# Patient Record
Sex: Female | Born: 1935 | Race: White | Hispanic: No | Marital: Married | Smoking: Never smoker
Health system: Southern US, Community
[De-identification: ages and names within clinical notes are randomized; demographics above are authoritative.]

## PROBLEM LIST (undated history)

## (undated) DIAGNOSIS — G3184 Mild cognitive impairment, so stated: Secondary | ICD-10-CM

## (undated) DIAGNOSIS — D696 Thrombocytopenia, unspecified: Secondary | ICD-10-CM

## (undated) DIAGNOSIS — K219 Gastro-esophageal reflux disease without esophagitis: Secondary | ICD-10-CM

## (undated) DIAGNOSIS — M810 Age-related osteoporosis without current pathological fracture: Secondary | ICD-10-CM

## (undated) DIAGNOSIS — W19XXXA Unspecified fall, initial encounter: Secondary | ICD-10-CM

## (undated) DIAGNOSIS — I639 Cerebral infarction, unspecified: Secondary | ICD-10-CM

## (undated) DIAGNOSIS — E538 Deficiency of other specified B group vitamins: Secondary | ICD-10-CM

## (undated) DIAGNOSIS — I10 Essential (primary) hypertension: Secondary | ICD-10-CM

## (undated) DIAGNOSIS — E785 Hyperlipidemia, unspecified: Secondary | ICD-10-CM

## (undated) DIAGNOSIS — I672 Cerebral atherosclerosis: Secondary | ICD-10-CM

## (undated) DIAGNOSIS — I2510 Atherosclerotic heart disease of native coronary artery without angina pectoris: Secondary | ICD-10-CM

## (undated) MED FILL — Romiplostim For Inj 250 MCG: SUBCUTANEOUS | Qty: 0.32 | Status: AC

## (undated) MED FILL — Romiplostim For Inj 250 MCG: SUBCUTANEOUS | Qty: 0.33 | Status: AC

## (undated) MED FILL — Romiplostim For Inj 125 MCG: SUBCUTANEOUS | Qty: 0.22 | Status: AC

---

## 2021-03-23 DIAGNOSIS — R5383 Other fatigue: Secondary | ICD-10-CM | POA: Insufficient documentation

## 2021-03-23 DIAGNOSIS — F0390 Unspecified dementia without behavioral disturbance: Secondary | ICD-10-CM | POA: Insufficient documentation

## 2021-03-23 DIAGNOSIS — F419 Anxiety disorder, unspecified: Secondary | ICD-10-CM | POA: Insufficient documentation

## 2021-03-23 DIAGNOSIS — Z780 Asymptomatic menopausal state: Secondary | ICD-10-CM | POA: Insufficient documentation

## 2021-03-23 DIAGNOSIS — R5381 Other malaise: Secondary | ICD-10-CM

## 2021-03-23 DIAGNOSIS — G3184 Mild cognitive impairment, so stated: Secondary | ICD-10-CM | POA: Insufficient documentation

## 2021-03-23 HISTORY — DX: Anxiety disorder, unspecified: F41.9

## 2021-03-23 HISTORY — DX: Other malaise: R53.81

## 2021-03-23 HISTORY — DX: Other malaise: R53.83

## 2021-03-24 DIAGNOSIS — D696 Thrombocytopenia, unspecified: Secondary | ICD-10-CM | POA: Insufficient documentation

## 2021-03-24 DIAGNOSIS — D693 Immune thrombocytopenic purpura: Secondary | ICD-10-CM | POA: Insufficient documentation

## 2021-03-24 DIAGNOSIS — E538 Deficiency of other specified B group vitamins: Secondary | ICD-10-CM | POA: Insufficient documentation

## 2021-04-20 DIAGNOSIS — M810 Age-related osteoporosis without current pathological fracture: Secondary | ICD-10-CM

## 2021-04-20 HISTORY — DX: Age-related osteoporosis without current pathological fracture: M81.0

## 2021-08-18 DIAGNOSIS — K219 Gastro-esophageal reflux disease without esophagitis: Secondary | ICD-10-CM | POA: Insufficient documentation

## 2021-08-18 DIAGNOSIS — M150 Primary generalized (osteo)arthritis: Secondary | ICD-10-CM

## 2021-08-18 DIAGNOSIS — M159 Polyosteoarthritis, unspecified: Secondary | ICD-10-CM

## 2021-08-18 HISTORY — DX: Gastro-esophageal reflux disease without esophagitis: K21.9

## 2021-08-18 HISTORY — DX: Polyosteoarthritis, unspecified: M15.9

## 2021-08-18 HISTORY — DX: Primary generalized (osteo)arthritis: M15.0

## 2021-09-26 DIAGNOSIS — F330 Major depressive disorder, recurrent, mild: Secondary | ICD-10-CM | POA: Insufficient documentation

## 2021-09-26 DIAGNOSIS — L821 Other seborrheic keratosis: Secondary | ICD-10-CM | POA: Insufficient documentation

## 2021-09-26 DIAGNOSIS — R351 Nocturia: Secondary | ICD-10-CM | POA: Insufficient documentation

## 2021-09-26 HISTORY — DX: Major depressive disorder, recurrent, mild: F33.0

## 2021-09-26 HISTORY — DX: Nocturia: R35.1

## 2021-09-26 HISTORY — DX: Other seborrheic keratosis: L82.1

## 2022-01-15 DIAGNOSIS — I351 Nonrheumatic aortic (valve) insufficiency: Secondary | ICD-10-CM | POA: Diagnosis not present

## 2022-01-15 DIAGNOSIS — I340 Nonrheumatic mitral (valve) insufficiency: Secondary | ICD-10-CM | POA: Diagnosis not present

## 2022-01-15 DIAGNOSIS — I361 Nonrheumatic tricuspid (valve) insufficiency: Secondary | ICD-10-CM | POA: Diagnosis not present

## 2022-01-23 DIAGNOSIS — Z9181 History of falling: Secondary | ICD-10-CM | POA: Insufficient documentation

## 2022-01-23 DIAGNOSIS — Z8673 Personal history of transient ischemic attack (TIA), and cerebral infarction without residual deficits: Secondary | ICD-10-CM

## 2022-01-23 DIAGNOSIS — I672 Cerebral atherosclerosis: Secondary | ICD-10-CM | POA: Insufficient documentation

## 2022-01-23 HISTORY — DX: History of falling: Z91.81

## 2022-01-23 HISTORY — DX: Personal history of transient ischemic attack (TIA), and cerebral infarction without residual deficits: Z86.73

## 2022-03-09 ENCOUNTER — Inpatient Hospital Stay (HOSPITAL_COMMUNITY)
Admission: EM | Admit: 2022-03-09 | Discharge: 2022-03-12 | DRG: 247 | Disposition: A | Discharge status: Home Health | Payer: Medicare Other | Source: Other Acute Inpatient Hospital | Attending: Cardiovascular Disease | Admitting: Cardiovascular Disease

## 2022-03-09 ENCOUNTER — Other Ambulatory Visit (HOSPITAL_COMMUNITY): Payer: Self-pay

## 2022-03-09 ENCOUNTER — Inpatient Hospital Stay (HOSPITAL_COMMUNITY): Payer: Medicare Other

## 2022-03-09 ENCOUNTER — Encounter (HOSPITAL_COMMUNITY): Payer: Self-pay | Admitting: Physician Assistant

## 2022-03-09 ENCOUNTER — Inpatient Hospital Stay (HOSPITAL_COMMUNITY)
Admission: EM | Disposition: A | Discharge status: Home Health | Payer: Self-pay | Source: Other Acute Inpatient Hospital | Attending: Cardiovascular Disease

## 2022-03-09 DIAGNOSIS — E785 Hyperlipidemia, unspecified: Secondary | ICD-10-CM | POA: Diagnosis present

## 2022-03-09 DIAGNOSIS — W06XXXA Fall from bed, initial encounter: Secondary | ICD-10-CM | POA: Diagnosis present

## 2022-03-09 DIAGNOSIS — M25532 Pain in left wrist: Secondary | ICD-10-CM | POA: Diagnosis not present

## 2022-03-09 DIAGNOSIS — I10 Essential (primary) hypertension: Secondary | ICD-10-CM | POA: Diagnosis present

## 2022-03-09 DIAGNOSIS — S0010XA Contusion of unspecified eyelid and periocular area, initial encounter: Secondary | ICD-10-CM | POA: Diagnosis present

## 2022-03-09 DIAGNOSIS — W19XXXA Unspecified fall, initial encounter: Secondary | ICD-10-CM

## 2022-03-09 DIAGNOSIS — F05 Delirium due to known physiological condition: Secondary | ICD-10-CM | POA: Diagnosis present

## 2022-03-09 DIAGNOSIS — R739 Hyperglycemia, unspecified: Secondary | ICD-10-CM | POA: Diagnosis present

## 2022-03-09 DIAGNOSIS — D649 Anemia, unspecified: Secondary | ICD-10-CM | POA: Diagnosis not present

## 2022-03-09 DIAGNOSIS — I672 Cerebral atherosclerosis: Secondary | ICD-10-CM | POA: Diagnosis present

## 2022-03-09 DIAGNOSIS — S0181XA Laceration without foreign body of other part of head, initial encounter: Secondary | ICD-10-CM | POA: Diagnosis present

## 2022-03-09 DIAGNOSIS — Z7982 Long term (current) use of aspirin: Secondary | ICD-10-CM

## 2022-03-09 DIAGNOSIS — I2582 Chronic total occlusion of coronary artery: Secondary | ICD-10-CM | POA: Diagnosis present

## 2022-03-09 DIAGNOSIS — I213 ST elevation (STEMI) myocardial infarction of unspecified site: Principal | ICD-10-CM

## 2022-03-09 DIAGNOSIS — D696 Thrombocytopenia, unspecified: Secondary | ICD-10-CM | POA: Diagnosis present

## 2022-03-09 DIAGNOSIS — I2119 ST elevation (STEMI) myocardial infarction involving other coronary artery of inferior wall: Principal | ICD-10-CM | POA: Diagnosis present

## 2022-03-09 DIAGNOSIS — Z79899 Other long term (current) drug therapy: Secondary | ICD-10-CM | POA: Diagnosis not present

## 2022-03-09 DIAGNOSIS — Z7983 Long term (current) use of bisphosphonates: Secondary | ICD-10-CM

## 2022-03-09 DIAGNOSIS — I2510 Atherosclerotic heart disease of native coronary artery without angina pectoris: Secondary | ICD-10-CM

## 2022-03-09 DIAGNOSIS — M810 Age-related osteoporosis without current pathological fracture: Secondary | ICD-10-CM | POA: Diagnosis present

## 2022-03-09 DIAGNOSIS — E782 Mixed hyperlipidemia: Secondary | ICD-10-CM | POA: Diagnosis not present

## 2022-03-09 DIAGNOSIS — Z20822 Contact with and (suspected) exposure to covid-19: Secondary | ICD-10-CM | POA: Diagnosis present

## 2022-03-09 DIAGNOSIS — Z9181 History of falling: Secondary | ICD-10-CM | POA: Diagnosis not present

## 2022-03-09 DIAGNOSIS — I2111 ST elevation (STEMI) myocardial infarction involving right coronary artery: Secondary | ICD-10-CM

## 2022-03-09 DIAGNOSIS — S0083XA Contusion of other part of head, initial encounter: Secondary | ICD-10-CM | POA: Diagnosis present

## 2022-03-09 DIAGNOSIS — E876 Hypokalemia: Secondary | ICD-10-CM | POA: Diagnosis present

## 2022-03-09 DIAGNOSIS — Z8249 Family history of ischemic heart disease and other diseases of the circulatory system: Secondary | ICD-10-CM

## 2022-03-09 DIAGNOSIS — K219 Gastro-esophageal reflux disease without esophagitis: Secondary | ICD-10-CM | POA: Diagnosis present

## 2022-03-09 DIAGNOSIS — E538 Deficiency of other specified B group vitamins: Secondary | ICD-10-CM | POA: Diagnosis present

## 2022-03-09 DIAGNOSIS — Z8673 Personal history of transient ischemic attack (TIA), and cerebral infarction without residual deficits: Secondary | ICD-10-CM

## 2022-03-09 DIAGNOSIS — Y92009 Unspecified place in unspecified non-institutional (private) residence as the place of occurrence of the external cause: Secondary | ICD-10-CM | POA: Diagnosis not present

## 2022-03-09 DIAGNOSIS — F01511 Vascular dementia, unspecified severity, with agitation: Secondary | ICD-10-CM | POA: Diagnosis present

## 2022-03-09 DIAGNOSIS — W19XXXD Unspecified fall, subsequent encounter: Secondary | ICD-10-CM | POA: Diagnosis not present

## 2022-03-09 HISTORY — DX: Cerebral infarction, unspecified: I63.9

## 2022-03-09 HISTORY — PX: LEFT HEART CATH AND CORONARY ANGIOGRAPHY: CATH118249

## 2022-03-09 HISTORY — DX: Mild cognitive impairment of uncertain or unknown etiology: G31.84

## 2022-03-09 HISTORY — DX: ST elevation (STEMI) myocardial infarction involving other coronary artery of inferior wall: I21.19

## 2022-03-09 HISTORY — DX: Gastro-esophageal reflux disease without esophagitis: K21.9

## 2022-03-09 HISTORY — DX: Unspecified fall, initial encounter: W19.XXXA

## 2022-03-09 HISTORY — DX: Cerebral atherosclerosis: I67.2

## 2022-03-09 HISTORY — DX: Atherosclerotic heart disease of native coronary artery without angina pectoris: I25.10

## 2022-03-09 HISTORY — DX: Age-related osteoporosis without current pathological fracture: M81.0

## 2022-03-09 HISTORY — PX: CORONARY/GRAFT ACUTE MI REVASCULARIZATION: CATH118305

## 2022-03-09 HISTORY — DX: Thrombocytopenia, unspecified: D69.6

## 2022-03-09 HISTORY — DX: Hyperlipidemia, unspecified: E78.5

## 2022-03-09 HISTORY — DX: Essential (primary) hypertension: I10

## 2022-03-09 HISTORY — DX: Deficiency of other specified B group vitamins: E53.8

## 2022-03-09 LAB — CBC
HCT: 43.5 % (ref 36.0–46.0)
Hemoglobin: 14 g/dL (ref 12.0–15.0)
MCH: 27.8 pg (ref 26.0–34.0)
MCHC: 32.2 g/dL (ref 30.0–36.0)
MCV: 86.5 fL (ref 80.0–100.0)
Platelets: 121 10*3/uL — ABNORMAL LOW (ref 150–400)
RBC: 5.03 MIL/uL (ref 3.87–5.11)
RDW: 15.1 % (ref 11.5–15.5)
WBC: 6.9 10*3/uL (ref 4.0–10.5)
nRBC: 0 % (ref 0.0–0.2)

## 2022-03-09 LAB — ECHOCARDIOGRAM COMPLETE
Area-P 1/2: 3.75 cm2
P 1/2 time: 462 msec
S' Lateral: 3.1 cm

## 2022-03-09 LAB — TSH: TSH: 3.164 u[IU]/mL (ref 0.350–4.500)

## 2022-03-09 LAB — TROPONIN I (HIGH SENSITIVITY)
Troponin I (High Sensitivity): 168 ng/L (ref <18)
Troponin I (High Sensitivity): >24000 ng/L (ref <18)

## 2022-03-09 LAB — POCT ACTIVATED CLOTTING TIME
Activated Clotting Time: 275 seconds
Activated Clotting Time: 317 seconds
Activated Clotting Time: 209 seconds
Activated Clotting Time: 179 seconds
Activated Clotting Time: 155 seconds

## 2022-03-09 LAB — COMPREHENSIVE METABOLIC PANEL
ALT: 17 U/L (ref 0–44)
AST: 23 U/L (ref 15–41)
Albumin: 3.5 g/dL (ref 3.5–5.0)
Alkaline Phosphatase: 77 U/L (ref 38–126)
Anion gap: 11 (ref 5–15)
BUN: 8 mg/dL (ref 8–23)
CO2: 26 mmol/L (ref 22–32)
Calcium: 8.9 mg/dL (ref 8.9–10.3)
Chloride: 102 mmol/L (ref 98–111)
Creatinine, Ser: 0.86 mg/dL (ref 0.44–1.00)
GFR, Estimated: >60 mL/min (ref >60)
Glucose, Bld: 159 mg/dL — ABNORMAL HIGH (ref 70–99)
Potassium: 3.2 mmol/L — ABNORMAL LOW (ref 3.5–5.1)
Sodium: 139 mmol/L (ref 135–145)
Total Bilirubin: 1.1 mg/dL (ref 0.3–1.2)
Total Protein: 6.6 g/dL (ref 6.5–8.1)

## 2022-03-09 LAB — HEMOGLOBIN A1C
Hgb A1c MFr Bld: 5.8 % — ABNORMAL HIGH (ref 4.8–5.6)
Mean Plasma Glucose: 119.76 mg/dL

## 2022-03-09 LAB — PROTIME-INR
INR: 1.2 (ref 0.8–1.2)
Prothrombin Time: 14.6 seconds (ref 11.4–15.2)

## 2022-03-09 LAB — RESP PANEL BY RT-PCR (FLU A&B, COVID) ARPGX2
Influenza A by PCR: NEGATIVE
Influenza B by PCR: NEGATIVE
SARS Coronavirus 2 by RT PCR: NEGATIVE

## 2022-03-09 SURGERY — LEFT HEART CATH AND CORONARY ANGIOGRAPHY
Anesthesia: LOCAL

## 2022-03-09 MED ORDER — HYDRALAZINE HCL 20 MG/ML IJ SOLN: 10.0000 mg | INTRAMUSCULAR | Status: AC | PRN

## 2022-03-09 MED ORDER — ONDANSETRON HCL 4 MG/2ML IJ SOLN: 4.0000 mg | Freq: Four times a day (QID) | INTRAMUSCULAR | Status: DC | PRN

## 2022-03-09 MED ORDER — VITAMIN B-12 1000 MCG PO TABS
1000.0000 ug | ORAL_TABLET | Freq: Every day | ORAL | Status: DC
  Administered 2022-03-09 – 2022-03-12 (×4): 1000 ug via ORAL
  Filled 2022-03-09: qty 2
  Filled 2022-03-09: qty 1
  Filled 2022-03-09 (×3): qty 2

## 2022-03-09 MED ORDER — ACETAMINOPHEN 325 MG PO TABS: 650.0000 mg | ORAL_TABLET | ORAL | Status: DC | PRN

## 2022-03-09 MED ORDER — SODIUM CHLORIDE 0.9% FLUSH: 3.0000 mL | INTRAVENOUS | Status: DC | PRN

## 2022-03-09 MED ORDER — ATROPINE SULFATE 1 MG/10ML IJ SOSY
PREFILLED_SYRINGE | INTRAMUSCULAR | Status: AC
  Filled 2022-03-09: qty 10

## 2022-03-09 MED ORDER — SODIUM CHLORIDE 0.9 % IV SOLN
Freq: Once | INTRAVENOUS | Status: DC
Start: 2022-03-09 — End: 2022-03-09

## 2022-03-09 MED ORDER — LIDOCAINE HCL (PF) 1 % IJ SOLN
INTRAMUSCULAR | Status: AC
  Filled 2022-03-09: qty 30

## 2022-03-09 MED ORDER — LABETALOL HCL 5 MG/ML IV SOLN: 10.0000 mg | INTRAVENOUS | Status: AC | PRN

## 2022-03-09 MED ORDER — ONDANSETRON HCL 4 MG/2ML IJ SOLN
4.0000 mg | Freq: Four times a day (QID) | INTRAMUSCULAR | Status: DC | PRN
Start: 2022-03-09 — End: 2022-03-09

## 2022-03-09 MED ORDER — SODIUM CHLORIDE 0.9 % IV SOLN: INTRAVENOUS | Status: AC

## 2022-03-09 MED ORDER — ACETAMINOPHEN 325 MG PO TABS
650.0000 mg | ORAL_TABLET | ORAL | Status: DC | PRN
  Administered 2022-03-10: 650 mg via ORAL
  Filled 2022-03-09: qty 2

## 2022-03-09 MED ORDER — TICAGRELOR 90 MG PO TABS
90.0000 mg | ORAL_TABLET | Freq: Two times a day (BID) | ORAL | Status: DC
Start: 2022-03-09 — End: 2022-03-12
  Administered 2022-03-09 – 2022-03-12 (×6): 90 mg via ORAL
  Filled 2022-03-09 (×6): qty 1

## 2022-03-09 MED ORDER — HEPARIN SODIUM (PORCINE) 5000 UNIT/ML IJ SOLN
5000.0000 [IU] | Freq: Three times a day (TID) | INTRAMUSCULAR | Status: DC
  Administered 2022-03-09 – 2022-03-12 (×7): 5000 [IU] via SUBCUTANEOUS
  Filled 2022-03-09 (×7): qty 1

## 2022-03-09 MED ORDER — HEPARIN (PORCINE) IN NACL 1000-0.9 UT/500ML-% IV SOLN
INTRAVENOUS | Status: AC
  Filled 2022-03-09: qty 1000

## 2022-03-09 MED ORDER — LIDOCAINE HCL (PF) 1 % IJ SOLN
INTRAMUSCULAR | Status: DC | PRN
  Administered 2022-03-09: 15 mL

## 2022-03-09 MED ORDER — HEPARIN SODIUM (PORCINE) 1000 UNIT/ML IJ SOLN
INTRAMUSCULAR | Status: DC | PRN
  Administered 2022-03-09: 7500 [IU] via INTRAVENOUS

## 2022-03-09 MED ORDER — SERTRALINE HCL 50 MG PO TABS
50.0000 mg | ORAL_TABLET | Freq: Every day | ORAL | Status: DC
  Administered 2022-03-09 – 2022-03-10 (×2): 50 mg via ORAL
  Filled 2022-03-09 (×2): qty 1

## 2022-03-09 MED ORDER — HEPARIN (PORCINE) IN NACL 1000-0.9 UT/500ML-% IV SOLN
INTRAVENOUS | Status: DC | PRN
Start: 2022-03-09 — End: 2022-03-09
  Administered 2022-03-09 (×2): 500 mL

## 2022-03-09 MED ORDER — TICAGRELOR 90 MG PO TABS
ORAL_TABLET | ORAL | Status: DC | PRN
  Administered 2022-03-09: 180 mg via ORAL

## 2022-03-09 MED ORDER — POTASSIUM CHLORIDE CRYS ER 20 MEQ PO TBCR
40.0000 meq | EXTENDED_RELEASE_TABLET | Freq: Once | ORAL | Status: AC
  Administered 2022-03-09: 40 meq via ORAL
  Filled 2022-03-09: qty 2

## 2022-03-09 MED ORDER — ATORVASTATIN CALCIUM 80 MG PO TABS
80.0000 mg | ORAL_TABLET | Freq: Every day | ORAL | Status: DC
  Administered 2022-03-09 – 2022-03-12 (×4): 80 mg via ORAL
  Filled 2022-03-09 (×4): qty 1

## 2022-03-09 MED ORDER — SODIUM CHLORIDE 0.9 % IV SOLN: 250.0000 mL | INTRAVENOUS | Status: DC | PRN

## 2022-03-09 MED ORDER — PANTOPRAZOLE SODIUM 40 MG PO TBEC
40.0000 mg | DELAYED_RELEASE_TABLET | Freq: Every day | ORAL | Status: DC
Start: 2022-03-09 — End: 2022-03-12
  Administered 2022-03-09 – 2022-03-12 (×4): 40 mg via ORAL
  Filled 2022-03-09 (×4): qty 1

## 2022-03-09 MED ORDER — VERAPAMIL HCL 2.5 MG/ML IV SOLN
INTRAVENOUS | Status: AC
  Filled 2022-03-09: qty 2

## 2022-03-09 MED ORDER — METOPROLOL TARTRATE 12.5 MG HALF TABLET
12.5000 mg | ORAL_TABLET | Freq: Two times a day (BID) | ORAL | Status: DC
  Administered 2022-03-09: 12.5 mg via ORAL
  Filled 2022-03-09 (×2): qty 1

## 2022-03-09 MED ORDER — DONEPEZIL HCL 5 MG PO TABS
5.0000 mg | ORAL_TABLET | Freq: Every day | ORAL | Status: DC
  Administered 2022-03-09 – 2022-03-12 (×4): 5 mg via ORAL
  Filled 2022-03-09 (×4): qty 1

## 2022-03-09 MED ORDER — NITROGLYCERIN 0.4 MG SL SUBL
0.4000 mg | SUBLINGUAL_TABLET | SUBLINGUAL | Status: DC | PRN
Start: 2022-03-09 — End: 2022-03-12

## 2022-03-09 MED ORDER — LOSARTAN POTASSIUM 50 MG PO TABS
50.0000 mg | ORAL_TABLET | Freq: Every day | ORAL | Status: DC
  Administered 2022-03-09 – 2022-03-12 (×4): 50 mg via ORAL
  Filled 2022-03-09 (×4): qty 1

## 2022-03-09 MED ORDER — MORPHINE SULFATE (PF) 2 MG/ML IV SOLN
2.0000 mg | INTRAVENOUS | Status: DC | PRN
  Administered 2022-03-09 – 2022-03-10 (×2): 2 mg via INTRAVENOUS
  Filled 2022-03-09 (×2): qty 1

## 2022-03-09 MED ORDER — HEPARIN SODIUM (PORCINE) 1000 UNIT/ML IJ SOLN
INTRAMUSCULAR | Status: AC
Start: 2022-03-09 — End: ?
  Filled 2022-03-09: qty 10

## 2022-03-09 MED ORDER — ATROPINE SULFATE 1 MG/10ML IJ SOSY
PREFILLED_SYRINGE | INTRAMUSCULAR | Status: DC | PRN
Start: 2022-03-09 — End: 2022-03-09
  Administered 2022-03-09: .5 mg via INTRAVENOUS

## 2022-03-09 MED ORDER — TICAGRELOR 90 MG PO TABS
ORAL_TABLET | ORAL | Status: AC
  Filled 2022-03-09: qty 2

## 2022-03-09 MED ORDER — SODIUM CHLORIDE 0.9% FLUSH
3.0000 mL | Freq: Two times a day (BID) | INTRAVENOUS | Status: DC
  Administered 2022-03-09 – 2022-03-12 (×7): 3 mL via INTRAVENOUS

## 2022-03-09 MED ORDER — ASPIRIN 81 MG PO CHEW
81.0000 mg | CHEWABLE_TABLET | Freq: Every day | ORAL | Status: DC
Start: 2022-03-10 — End: 2022-03-12
  Administered 2022-03-10 – 2022-03-12 (×3): 81 mg via ORAL
  Filled 2022-03-09 (×3): qty 1

## 2022-03-09 SURGICAL SUPPLY — 22 items
BALLN SAPPHIRE 2.0X12 (BALLOONS) ×2
BALLN ~~LOC~~ EUPHORA RX 3.75X15 (BALLOONS) ×2
BALLOON SAPPHIRE 2.0X12 (BALLOONS) IMPLANT
BALLOON ~~LOC~~ EUPHORA RX 3.75X15 (BALLOONS) IMPLANT
CATH 5FR JL4 DIAGNOSTIC (CATHETERS) ×1 IMPLANT
CATH 5FR JR4 DIAGNOSTIC (CATHETERS) ×1 IMPLANT
CATH INFINITI 5FR ANG PIGTAIL (CATHETERS) ×1 IMPLANT
CATH VISTA GUIDE 6FR JR4 (CATHETERS) ×1 IMPLANT
ELECT DEFIB PAD ADLT CADENCE (PAD) ×1 IMPLANT
GLIDESHEATH SLEND A-KIT 6F 22G (SHEATH) ×1 IMPLANT
KIT ENCORE 26 ADVANTAGE (KITS) ×1 IMPLANT
KIT HEART LEFT (KITS) ×2 IMPLANT
PACK CARDIAC CATHETERIZATION (CUSTOM PROCEDURE TRAY) ×2 IMPLANT
SHEATH PINNACLE 6F 10CM (SHEATH) ×1 IMPLANT
STENT SYNERGY XD 3.50X20 (Permanent Stent) IMPLANT
SYNERGY XD 3.50X20 (Permanent Stent) ×2 IMPLANT
SYR MEDRAD MARK 7 150ML (SYRINGE) ×2 IMPLANT
TRANSDUCER W/STOPCOCK (MISCELLANEOUS) ×2 IMPLANT
TUBING CIL FLEX 10 FLL-RA (TUBING) ×2 IMPLANT
WIRE ASAHI PROWATER 180CM (WIRE) ×1 IMPLANT
WIRE EMERALD 3MM-J .035X150CM (WIRE) ×1 IMPLANT
WIRE HI TORQ VERSACORE-J 145CM (WIRE) ×1 IMPLANT

## 2022-03-09 NOTE — Plan of Care (Signed)

## 2022-03-09 NOTE — Progress Notes (Signed)
*  PRELIMINARY RESULTS* ?Echocardiogram ?2D Echocardiogram has been performed. ? ?Stacey Drain ?03/09/2022, 2:43 PM ?

## 2022-03-09 NOTE — ED Provider Notes (Signed)
?[facility] EMERGENCY DEPARTMENT ?Provider Note ? ? ?CSN: [id number] ?Arrival date & time: 03/09/22  0762 ? ?  ? ?History ? ?Chief Complaint  ?Patient presents with  ? Code STEMI  ? ? ?Maria Holloway is a 86 y.o. female. ? ?HPI ?Patient is an 86 year old female presenting to the ER today as a code STEMI from [facility].  Seen for chest pain as well as a fall.  She states that she fell this morning and struck her head. ? ?She denies significant headache.  She denies any leg numbness or weakness.  She denies any slurred speech or confusion.  She had a negative head CT scan done at [facility].  Has been given morphine for pain, heparin, aspirin, Zofran and was transported to the [facility] emergency room for catheterization. Cardiology at bedside currently  ? ?  ? ?Home Medications ?Prior to Admission medications   ?Not on File  ?   ? ?Allergies    ?Patient has no known allergies.   ? ?Review of Systems   ?Review of Systems ? ?Physical Exam ?Updated Vital Signs ?BP (!) 185/103 (BP Location: Left Arm)   Pulse 77   Temp (!) 97.4 ?F (36.3 ?C) (Axillary)   Resp (!) 24   SpO2 99%  ?Physical Exam ?Vitals and nursing note reviewed.  ?Constitutional:   ?   General: She is not in acute distress. ?   Comments: Thin 86 year old female in no acute distress answers questions appropriately and follow commands, pleasant  ?HENT:  ?   Head: Normocephalic.  ?   Comments: Bruising over left eyebrow and between eyebrows EOMI, PERRLA, no hyphema ?   Nose: Nose normal.  ?   Mouth/Throat:  ?   Mouth: Mucous membranes are moist.  ?Eyes:  ?   General: No scleral icterus. ?Cardiovascular:  ?   Rate and Rhythm: Normal rate and regular rhythm.  ?   Pulses: Normal pulses.  ?   Heart sounds: Normal heart sounds.  ?   Comments: No murmurs rubs or gallops ?Pulmonary:  ?   Effort: Pulmonary effort is normal. No respiratory distress.  ?   Breath sounds: No wheezing.  ?   Comments: No crackles ?Speaking in full  sentences ?Abdominal:  ?   Palpations: Abdomen is soft.  ?   Tenderness: There is no abdominal tenderness. There is no guarding or rebound.  ?Musculoskeletal:  ?   Cervical back: Normal range of motion.  ?   Right lower leg: No edema.  ?   Left lower leg: No edema.  ?   Comments: No lower extremity edema  ?Skin: ?   General: Skin is warm and dry.  ?   Capillary Refill: Capillary refill takes less than 2 seconds.  ?Neurological:  ?   Mental Status: She is alert. Mental status is at baseline.  ?Psychiatric:     ?   Mood and Affect: Mood normal.     ?   Behavior: Behavior normal.  ? ? ?ED Results / Procedures / Treatments   ?Labs ?(all labs ordered are listed, but only abnormal results are displayed) ?Labs Reviewed  ?RESP PANEL BY RT-PCR (FLU A&B, COVID) ARPGX2  ?CBC  ?COMPREHENSIVE METABOLIC PANEL  ?HEMOGLOBIN A1C  ?TSH  ?PROTIME-INR  ?TROPONIN I (HIGH SENSITIVITY)  ? ? ?EKG ?EKG Interpretation ? ?Date/Time:  Thursday March 09 2022 06:48:27 EDT ?Ventricular Rate:  79 ?PR Interval:  153 ?QRS Duration: 103 ?QT Interval:  390 ?QTC Calculation: 448 ?R  Axis:   97 ?Text Interpretation: Sinus rhythm Inferoposterior infarct, acute (RCA) Probable RV involvement, suggest recording right precordial leads >>> Acute MI <<< Confirmed by Marily Memos [phone]) on 03/09/2022 6:54:57 AM ? ?Radiology ?No results found. ? ?Procedures ?Marland KitchenCritical Care ?Performed by: Gailen Shelter, PA ?Authorized by: Gailen Shelter, PA  ? ?Critical care provider statement:  ?  Critical care time (minutes):  35 ?  Critical care time was exclusive of:  Separately billable procedures and treating other patients and teaching time ?  Critical care was time spent personally by me on the following activities:  Development of treatment plan with patient or surrogate, review of old charts, re-evaluation of patient's condition, pulse oximetry, ordering and review of radiographic studies, ordering and review of laboratory studies, ordering and performing treatments  and interventions, obtaining history from patient or surrogate, examination of patient and evaluation of patient's response to treatment ?  Care discussed with: admitting provider    ? ? ?Medications Ordered in ED ?Medications  ?Heparin (Porcine) in NaCl 1000-0.9 UT/500ML-% SOLN (500 mLs  Given 03/09/22 0715)  ?lidocaine (PF) (XYLOCAINE) 1 % injection (15 mLs  Given 03/09/22 0718)  ?heparin sodium (porcine) injection (7,500 Units Intravenous Given 03/09/22 0724)  ?ticagrelor (BRILINTA) tablet (180 mg Oral Given 03/09/22 0725)  ? ? ?ED Course/ Medical Decision Making/ A&P ?  ?                        ?Medical Decision Making ? ?Patient is an 86 year old female presenting to the ER today as a code STEMI from [facility].  Seen for chest pain as well as a fall.  She states that she fell this morning and struck her head. ? ?She denies significant headache.  She denies any leg numbness or weakness.  She denies any slurred speech or confusion.  She had a negative head CT scan done at [facility].  Has been given morphine for pain, heparin, aspirin, Zofran and was transported to the [facility] emergency room for catheterization ? ? ?Patient with inferior ST elevation lateral depressions. ?Hypertensive no episodes of hypotension.  No arrhythmias on monitor per EMS. ? ?Patient has already had negative head CT.  She is neurologically intact on my examination. ? ?Is going to Cath Lab at this time. ? ?CBC unremarkable except for mild thrombocytopenia.  He is currently on heparin.  Will need to have this monitored.  Coags, CMP, TSH A1c COVID influenza pending at this time. ? ?No longer in room at time of note signing.  ? ?Final Clinical Impression(s) / ED Diagnoses ?Final diagnoses:  ?ST elevation myocardial infarction (STEMI), unspecified artery (HCC)  ? ? ?Rx / DC Orders ?ED Discharge Orders   ? ? None  ? ?  ? ? ?  ?Gailen Shelter, [place] ?03/09/22 6237 ? ?  ?Gwyneth Sprout, MD ?03/10/22 [phone] ? ?

## 2022-03-09 NOTE — H&P (Addendum)
?Cardiology Admission History and Physical:  ? ?Patient ID: Maria Holloway ?MRN: [id number]; DOB: May 22, 1936  ? ?Admission date: 03/09/2022 ? ?PCP:  Gordan Payment., MD ?  ?[facility] Providers ?Cardiologist:  New -> txfer from [facility] ? ? ?Chief Complaint:  chest pain, fall ? ?Patient Profile:  ? ?Maria Holloway is a 86 y.o. female with HTN, HLD, prior stroke ~01/2022 (on ASA PTA), cerebral atherosclerosis (details unclear), thrombocytopenia, vitamin B12 deficiency, osteoporosis, GERD who is being seen 03/09/2022 for the evaluation of inferior STEMI. ? ?She presented to [facility] with fall and chest pain, transferred emergently to [facility] ? ?History of Present Illness:  ? ?Ms. Hantz has no prior cardiac history. She has mild cognitive impairment at baseline, on donepezil, and does not really know her medical history beyond HTN, HLD, and prior stroke. Therefore, history obtained from chart and talking to husband. She had a prior stroke in 01/2022 treated at [facility], further details unclear, but no hx of intracranial hemorrhage. She was placed on baby ASA. She also has a history of falls.  ? ?This morning her husband woke up to the sound of something hitting the floor. He went to her side and found her on the floor having fallen. She was awake/alert but could not recall how she got there. She was also complaining of some abdominal pain moving up into her chest associated with nausea. He brought her to [facility] where she also was complaining of chest pain. Initial BP 206/88. EKG showed NSR with inferior STEMI so code STEMI was activated. She had a prominent forehead hematoma and so stat CT head was obtained which showed no evidence of intracranial injury; + forehead hematoma without calvarial fracture, chronic small vessel disease with infarcts. She received 324mg  of ASA, 4mg  Zofran, 4mg  morphine and 4000 of heparin prior to arrival to [facility] per verbal report from EMS. The ED  used Dermabond on her forehead laceration. Upon arrival she is still having some chest pain but is vague in description. Husband also arrived as well. They both confirm she is full code. ? ?Though she has short term memory issues, she is alert/oriented to date, place (knows hospital but not the name of this hospital), and self. ? ?Labs @ [place]: K 3.2, Cr 0.70, albumin 3.6, LFTS OK, BNP 1730, troponin 0.22 ("gray zone"),  PT/INR 1.0, WBC 5.8, Hgb 13.4, plt 119, glucose 166. ? ?Past Medical History:  ?Diagnosis Date  ? Cerebral atherosclerosis   ? Essential hypertension   ? Falls   ? GERD (gastroesophageal reflux disease)   ? Hyperlipidemia   ? Mild cognitive impairment   ? Osteoporosis   ? Stroke [facility])   ? Thrombocytopenia (HCC)   ? Vitamin B12 deficiency   ? ? ?History reviewed. No pertinent surgical history.  ? ?Medications Prior to Admission: ?Pending med rec - husband not completely sure - knows losartan and donepezil, possibly ? sertraline ? ?Allergies:   No Known Allergies - confirmed with pt/husband ? ?Social History:   ?Social History  ? ?Socioeconomic History  ? Marital status: Married  ?  Spouse name: Not on file  ? Number of children: Not on file  ? Years of education: Not on file  ? Highest education level: Not on file  ?Occupational History  ? Not on file  ?Tobacco Use  ? Smoking status: Never  ? Smokeless tobacco: Never  ?Substance and Sexual Activity  ? Alcohol use: Yes  ?  Comment: 1/2 glass of wine  on New Year's  ? Drug use: Never  ? Sexual activity: Never  ?Other Topics Concern  ? Not on file  ?Social History Narrative  ? Not on file  ? ?Social Determinants of Health  ? ?Financial Resource Strain: Not on file  ?Food Insecurity: Not on file  ?Transportation Needs: Not on file  ?Physical Activity: Not on file  ?Stress: Not on file  ?Social Connections: Not on file  ?Intimate Partner Violence: Not on file  ?  ?Family History:   ?The patient's family history includes CAD in her father.   ? ?ROS:   ?Please see the history of present illness.  ?All other ROS reviewed and negative though memory loss makes this challenging. ? ?Physical Exam/Data:  ? ?Vitals:  ? 03/09/22 [id number] 03/09/22 [id number] 03/09/22 [id number] 03/09/22 0714  ?BP:  (!) 185/103 (!) 185/103   ?Pulse: 80 77    ?Resp: 19 13 (!) 24   ?Temp: (!) 97.4 ?F (36.3 ?C)     ?TempSrc: Axillary     ?SpO2:  99% 100% 99%  ? ?No intake or output data in the 24 hours ending 03/09/22 0759 ?   ? View : No data to display.  ?  ?  ?  ?   ?There is no height or weight on file to calculate BMI.  ?General: Well developed, well nourished elderly WF, in no acute distress. ?Head: Normocephalic, + quarter sized forehead hematoma between eyebrowns, sclera non-icteric, no xanthomas, nares are without discharge. ?Neck: Negative for carotid bruits. JVP not elevated. ?Lungs: Clear bilaterally to auscultation without wheezes, rales, or rhonchi. Breathing is unlabored. ?Heart: RRR S1 S2 without murmurs, rubs, or gallops.  ?Abdomen: Soft, non-tender, non-distended with normoactive bowel sounds. No rebound/guarding. ?Extremities: No clubbing or cyanosis. No edema. Distal pedal pulses are 2+ and equal bilaterally. ?Neuro: Alert and oriented to self, hospital, date, but otherwise memory is poor. Moves all extremities spontaneously. ?Psych:  Otherwise responds to questions appropriately with a normal affect. ? ?EKG:  The EKG that was done today was personally reviewed and demonstrates NSR 71bpm inferior ST elevation II, III, avF up to 3-674mm in lead III with reciprocal ST depression. Repeat tracing here similar. ? ?Relevant CV Studies: ?N/A ? ?Laboratory Data: ? ?High Sensitivity Troponin:  No results for input(s): TROPONINIHS in the last 720 hours.    ?ChemistryNo results for input(s): NA, K, CL, CO2, GLUCOSE, BUN, CREATININE, CALCIUM, MG, GFRNONAA, GFRAA, ANIONGAP in the last 168 hours.  ?No results for input(s): PROT, ALBUMIN, AST, ALT, ALKPHOS, BILITOT in the last 168 hours. ?Lipids No  results for input(s): CHOL, TRIG, HDL, LABVLDL, LDLCALC, CHOLHDL in the last 168 hours. ?Hematology ?Recent Labs  ?Lab 03/09/22 ?0655  ?WBC 6.9  ?RBC 5.03  ?HGB 14.0  ?HCT 43.5  ?MCV 86.5  ?MCH 27.8  ?MCHC 32.2  ?RDW 15.1  ?PLT 121*  ? ?Thyroid No results for input(s): TSH, FREET4 in the last 168 hours. ?BNPNo results for input(s): BNP, PROBNP in the last 168 hours.  ?DDimer No results for input(s): DDIMER in the last 168 hours. ? ? ?Radiology/Studies:  ?No results found. ? ? ?Assessment and Plan:  ? ?1. Acute inferior STEMI ?- to cath lab emergently for further evaluation ?- husband updated, waiting in 2H waiting room ? ?2. Fall with forehead hematoma ?- CT head without acute intracranial abnormality ?- s/p dermabond at OSH ?- follow clinically ?- no other signs of trauma at this time; patient denies any other complaints ?- will need PT  eval when clinically stable ? ?3. Essential HTN ?- BP to be managed in context of the above, pharm med rec pending at this time ? ?4. Hyperlipidemia ?- anticipate high intensity statin rx post-cath ? ?5. History of stroke ?- maintained on ASA PTA ? ?6. Mild cognitive impairment  ?- will clarify home med regimen; husband assists in care ? ?7. Mild thrombocytopenia, present on admission ?- prior history noted of this in PMH ?- follow while inpatient ? ?8. Hypokalemia ?- K 3.2, will  ? ?9. Hyperglycemia ?- check A1C with labs ? ?Code status: confirmed with pt/husband she is full code ?DVT ppx: confirmed w/ Dr. Allyson Sabal plan for heparin [place] DVT dosing post cath starting tonight ? ?Risk Assessment/Risk Scores:  ?  ?TIMI Risk Score for ST  Elevation MI:   ?The patient's TIMI risk score is 5, which indicates a 12.4% risk of all cause mortality at 30 days.  ? ? ? ?Severity of Illness: ?The appropriate patient status for this patient is INPATIENT. Inpatient status is judged to be reasonable and necessary in order to provide the required intensity of service to ensure the patient's safety. The  patient's presenting symptoms, physical exam findings, and initial radiographic and laboratory data in the context of their chronic comorbidities is felt to place them at high risk for further clinical deteriorati

## 2022-03-09 NOTE — ED Triage Notes (Signed)
Pt arrived via [facility] for transfer for emergent cath. Pt was seen at [facility] for chest pain and fall, hematoma noted on patients forehead over left eyebrow, STEMI shown on EKG. Pt received morphine, heparin, ASA, Zofran and negative head CT prior to transport to [facility] for cardiac cath. A&Ox3, GCS 15.  ?

## 2022-03-09 NOTE — TOC Benefit Eligibility Note (Signed)
Patient Advocate Encounter ? ?Insurance verification completed.   ? ?The patient is currently admitted and upon discharge could be taking Brilinta 90 mg. ? ?The current 30 day co-pay is, $120.00.  ? ?The patient is insured through [employer] Part D  ? ? ? ?Roland Earl, CPhT ?Pharmacy Patient Advocate Specialist ?[facility] Patient Advocate Team ?Direct Number: [phone]  Fax: [phone] ? ? ? ? ? ?  ?

## 2022-03-09 NOTE — Progress Notes (Addendum)
Site area: Right groin a 6 french arterial sheath was removed ? ?Site Prior to Removal:  Level 0 ? ?Pressure Applied For 30 MINUTES   ? ?Bedrest Beginning at 1530pm X 4 hours ? ?Manual:   Yes.   ? ?Patient Status During Pull:  stable ? ?Post Pull Groin Site:  Level 0- Bruise ? ?Post Pull Instructions Given:  Yes.   ? ?Post Pull Pulses Present:  Yes.   ? ?Dressing Applied:  Yes.  Pressure dressing applied. ? ?Comments:  Pt confused and  not able to remember to keep the right leg relax after being reminded.  Husband at bedside and care instructions given.  RN check site also. ?

## 2022-03-10 ENCOUNTER — Other Ambulatory Visit (HOSPITAL_COMMUNITY): Payer: Self-pay

## 2022-03-10 DIAGNOSIS — W19XXXD Unspecified fall, subsequent encounter: Secondary | ICD-10-CM

## 2022-03-10 DIAGNOSIS — F05 Delirium due to known physiological condition: Secondary | ICD-10-CM | POA: Diagnosis not present

## 2022-03-10 DIAGNOSIS — I10 Essential (primary) hypertension: Secondary | ICD-10-CM

## 2022-03-10 HISTORY — DX: Delirium due to known physiological condition: F05

## 2022-03-10 LAB — URINALYSIS, ROUTINE W REFLEX MICROSCOPIC
Bacteria, UA: NONE SEEN
Bilirubin Urine: NEGATIVE
Glucose, UA: NEGATIVE mg/dL
Hgb urine dipstick: NEGATIVE
Ketones, ur: 20 mg/dL — AB
Leukocytes,Ua: NEGATIVE
Nitrite: NEGATIVE
Protein, ur: 30 mg/dL — AB
Specific Gravity, Urine: 1.019 (ref 1.005–1.030)
pH: 6 (ref 5.0–8.0)

## 2022-03-10 LAB — LIPID PANEL
Cholesterol: 114 mg/dL (ref 0–200)
HDL: 34 mg/dL — ABNORMAL LOW (ref >40)
LDL Cholesterol: 62 mg/dL (ref 0–99)
Total CHOL/HDL Ratio: 3.4 RATIO
Triglycerides: 90 mg/dL (ref <150)
VLDL: 18 mg/dL (ref 0–40)

## 2022-03-10 LAB — CBC
HCT: 34.9 % — ABNORMAL LOW (ref 36.0–46.0)
Hemoglobin: 11.3 g/dL — ABNORMAL LOW (ref 12.0–15.0)
MCH: 27.6 pg (ref 26.0–34.0)
MCHC: 32.4 g/dL (ref 30.0–36.0)
MCV: 85.3 fL (ref 80.0–100.0)
Platelets: 99 10*3/uL — ABNORMAL LOW (ref 150–400)
RBC: 4.09 MIL/uL (ref 3.87–5.11)
RDW: 15.4 % (ref 11.5–15.5)
WBC: 6.5 10*3/uL (ref 4.0–10.5)
nRBC: 0 % (ref 0.0–0.2)

## 2022-03-10 LAB — BASIC METABOLIC PANEL
Anion gap: 8 (ref 5–15)
BUN: 11 mg/dL (ref 8–23)
CO2: 22 mmol/L (ref 22–32)
Calcium: 8.5 mg/dL — ABNORMAL LOW (ref 8.9–10.3)
Chloride: 107 mmol/L (ref 98–111)
Creatinine, Ser: 0.79 mg/dL (ref 0.44–1.00)
GFR, Estimated: >60 mL/min (ref >60)
Glucose, Bld: 129 mg/dL — ABNORMAL HIGH (ref 70–99)
Potassium: 4.1 mmol/L (ref 3.5–5.1)
Sodium: 137 mmol/L (ref 135–145)

## 2022-03-10 MED ORDER — SENNOSIDES-DOCUSATE SODIUM 8.6-50 MG PO TABS
1.0000 | ORAL_TABLET | Freq: Every day | ORAL | Status: DC
  Administered 2022-03-10: 1 via ORAL
  Filled 2022-03-10: qty 1

## 2022-03-10 MED ORDER — HALOPERIDOL LACTATE 5 MG/ML IJ SOLN
5.0000 mg | Freq: Once | INTRAMUSCULAR | Status: AC
  Administered 2022-03-10: 5 mg via INTRAVENOUS
  Filled 2022-03-10: qty 1

## 2022-03-10 MED ORDER — METOPROLOL TARTRATE 25 MG PO TABS
25.0000 mg | ORAL_TABLET | Freq: Two times a day (BID) | ORAL | Status: DC
Start: 2022-03-10 — End: 2022-03-12
  Administered 2022-03-10 – 2022-03-12 (×5): 25 mg via ORAL
  Filled 2022-03-10 (×5): qty 1

## 2022-03-10 MED ORDER — LORAZEPAM 0.5 MG PO TABS
0.5000 mg | ORAL_TABLET | Freq: Four times a day (QID) | ORAL | Status: AC
  Administered 2022-03-10 (×2): 0.5 mg via ORAL
  Filled 2022-03-10 (×3): qty 1

## 2022-03-10 MED ORDER — MELATONIN 5 MG PO TABS
10.0000 mg | ORAL_TABLET | Freq: Once | ORAL | Status: AC
  Administered 2022-03-10: 10 mg via ORAL
  Filled 2022-03-10: qty 2

## 2022-03-10 MED ORDER — HALOPERIDOL LACTATE 5 MG/ML IJ SOLN
2.0000 mg | Freq: Four times a day (QID) | INTRAMUSCULAR | Status: DC | PRN
  Administered 2022-03-10: 2 mg via INTRAVENOUS
  Filled 2022-03-10: qty 1

## 2022-03-10 MED ORDER — DIAZEPAM 5 MG PO TABS
2.5000 mg | ORAL_TABLET | Freq: Three times a day (TID) | ORAL | Status: DC | PRN
  Administered 2022-03-10: 5 mg via ORAL
  Filled 2022-03-10: qty 1

## 2022-03-10 MED ORDER — CHLORHEXIDINE GLUCONATE CLOTH 2 % EX PADS
6.0000 | MEDICATED_PAD | Freq: Every day | CUTANEOUS | Status: DC
  Administered 2022-03-10 – 2022-03-11 (×2): 6 via TOPICAL

## 2022-03-10 NOTE — Subjective & Objective (Signed)
Maria Holloway, an 86 y/o who had no prior cardiac history. She has mild cognitive impairment at baseline, on donepezil and sertraline. She was unable to relate her medical history beyond HTN, HLD, and prior stroke. More complete history obtained from chart and talking to husband. She had a prior stroke in 01/2022 treated at [facility], further details unclear, but no hx of intracranial hemorrhage. She was placed on baby ASA. She also has a history of falls.  ?  ?The morning of admission her husband woke up to the sound of something hitting the floor. He went to her side and found her on the floor having fallen. She was awake/alert but could not recall how she got there. She was also complaining of some abdominal pain moving up into her chest associated with nausea. He brought her to [facility] where she also was complaining of chest pain. Initial BP 206/88. EKG showed NSR with inferior STEMI. A code STEMI was activated. She had a prominent forehead hematoma and so stat CT head was obtained which showed no evidence of intracranial injury; + forehead hematoma without calvarial fracture, chronic small vessel disease with infarcts. She received 324mg  of ASA, 4mg  Zofran, 4mg  morphine and 4000 of heparin prior to arrival to [facility] per verbal report from EMS. The ED used Dermabond on her forehead laceration. Upon arrival she is still having some chest pain but is vague in description. Husband also arrived as well. They both confirm she is full code. ?  ?She was taken emergently to the cardiac catherization lab an dhad PCI with Stent to RCA. She did well. On this first post op day she became increasingly agitated, trying to climb out of bed and posing a danger to herself. She required wrist restaints and was acutely medicated with dizaepam. TRH called to assist with management of delerium superimposed on mild dementia. ?

## 2022-03-10 NOTE — Assessment & Plan Note (Signed)
BP mildly elevated. Management per cardiology ?

## 2022-03-10 NOTE — Assessment & Plan Note (Signed)
Superficial injury with periorbital ecchymosis and swelling. Initiatial CT negative. ? ?Plan  For persistent delerium or mental status decline - STAT CT ?

## 2022-03-10 NOTE — Progress Notes (Signed)
Pt becoming increasingly agitated, while also trying to climb out of bed. Attempted to redirect patient but attempts were unsuccessful. Mitts placed on patient. Dr. Allyson Sabal paged requesting medication for agitation, stated he would consult a hospitalist to manage this issue.  ?

## 2022-03-10 NOTE — Assessment & Plan Note (Signed)
Initial lab reveals good control ?

## 2022-03-10 NOTE — TOC Progression Note (Signed)
Transition of Care (TOC) - Progression Note  ? ? ?Patient Details  ?Name: Christan Ciccarelli ?MRN: [id number] ?Date of Birth: [birth year] ? ?Transition of Care (TOC) CM/SW Contact  ?Leone Haven, RN ?Phone Number: ?03/10/2022, 8:12 AM ? ?Clinical Narrative:    ? ?Transition of Care (TOC) Screening Note ? ? ?Patient Details  ?Name: Kamela Blansett ?Date of Birth: 1936-05-12 ? ? ?Transition of Care (TOC) CM/SW Contact:    ?Leone Haven, RN ?Phone Number: ?03/10/2022, 8:12 AM ? ? ? ?Transition of Care Department [facility]) has reviewed patient and no TOC needs have been identified at this time. We will continue to monitor patient advancement through interdisciplinary progression rounds. If new patient transition needs arise, please place a TOC consult. ?  ? ? ?  ?  ? ?Expected Discharge Plan and Services ?  ?  ?  ?  ?  ?                ?  ?  ?  ?  ?  ?  ?  ?  ?  ?  ? ? ?Social Determinants of Health (SDOH) Interventions ?  ? ?Readmission Risk Interventions ?   ? View : No data to display.  ?  ?  ?  ? ? ?

## 2022-03-10 NOTE — Progress Notes (Signed)
Pt not appropriate for ambulation/education today due to acute confusion/agitation. Will f/u tomorrow. ?Maria Holloway CES, ACSM ?1:44 PM ?03/10/2022 ? ?

## 2022-03-10 NOTE — Progress Notes (Signed)
EKG CRITICAL VALUE  ? ? ? ?12 lead EKG performed.  Critical value noted.  Dairl Ponder, RN notified. ? ? ?Marzella Schlein Brogen Duell, CCT ?03/10/2022 7:11 AM   ?

## 2022-03-10 NOTE — Progress Notes (Signed)
? ?Progress Note ? ?Patient Name: Maria Holloway ?Date of Encounter: 03/10/2022 ? ?[facility] Cardiologist: Dr. Quay Burow ? ?Subjective  ? ?Postop day 1 inferior STEMI status post RCA PCI and stenting.  Patient denies chest pain.  She does have extensive bruising across her face from a fall at home. ? ?Inpatient Medications  ?  ?Scheduled Meds: ? aspirin  81 mg Oral Daily  ? atorvastatin  80 mg Oral Daily  ? Chlorhexidine Gluconate Cloth  6 each Topical Daily  ? donepezil  5 mg Oral Daily  ? heparin  5,000 Units Subcutaneous Q8H  ? losartan  50 mg Oral Daily  ? metoprolol tartrate  25 mg Oral BID  ? pantoprazole  40 mg Oral Daily  ? sertraline  50 mg Oral Daily  ? sodium chloride flush  3 mL Intravenous Q12H  ? ticagrelor  90 mg Oral BID  ? vitamin B-12  1,000 mcg Oral Daily  ? ?Continuous Infusions: ? sodium chloride    ? ?PRN Meds: ?sodium chloride, acetaminophen, morphine injection, nitroGLYCERIN, ondansetron (ZOFRAN) IV, sodium chloride flush  ? ?Vital Signs  ?  ?Vitals:  ? 03/10/22 0534 03/10/22 0535 03/10/22 0600 03/10/22 0817  ?BP:  (!) 162/89 (!) 166/88   ?Pulse:  (!) 103 100   ?Resp:  20 (!) 32   ?Temp:    97.9 ?F (36.6 ?C)  ?TempSrc:    Oral  ?SpO2:  95% (!) 89%   ?Weight: 60.5 kg     ? ? ?Intake/Output Summary (Last 24 hours) at 03/10/2022 0933 ?Last data filed at 03/10/2022 0500 ?Gross per 24 hour  ?Intake 1317.5 ml  ?Output 1100 ml  ?Net 217.5 ml  ? ? ?  03/10/2022  ?  5:34 AM  ?Last 3 Weights  ?Weight (lbs) 133 lb 6.1 oz  ?Weight (kg) 60.5 kg  ?   ? ?Telemetry  ?  ?Sinus rhythm/sinus tachycardia- Personally Reviewed ? ?ECG  ?  ?Sinus rhythm with inferior T wave inversion- Personally Reviewed ? ?Physical Exam  ? ?GEN: No acute distress.  Extensive bruising across both eye sockets ?Neck: No JVD ?Cardiac: RRR, no murmurs, rubs, or gallops.  ?Respiratory: Clear to auscultation bilaterally. ?GI: Soft, nontender, non-distended  ?MS: No edema; No deformity. ?Neuro:  Nonfocal  ?Psych: Normal affect   ?Extremities: Right groin puncture appears stable ? ?Labs  ?  ?High Sensitivity Troponin:   ?Recent Labs  ?Lab 03/09/22 ?0655 03/09/22 ?1234  ?TROPONINIHS 168* >24,000*  ?   ?Chemistry ?Recent Labs  ?Lab 03/09/22 ?0655 03/10/22 ?5176  ?NA 139 137  ?K 3.2* 4.1  ?CL 102 107  ?CO2 26 22  ?GLUCOSE 159* 129*  ?BUN 8 11  ?CREATININE 0.86 0.79  ?CALCIUM 8.9 8.5*  ?PROT 6.6  --   ?ALBUMIN 3.5  --   ?AST 23  --   ?ALT 17  --   ?ALKPHOS 77  --   ?BILITOT 1.1  --   ?GFRNONAA >60 >60  ?ANIONGAP 11 8  ?  ?Lipids  ?Recent Labs  ?Lab 03/10/22 ?1607  ?CHOL 114  ?TRIG 90  ?HDL 34*  ?[place] 62  ?CHOLHDL 3.4  ?  ?Hematology ?Recent Labs  ?Lab 03/09/22 ?0655 03/10/22 ?3710  ?WBC 6.9 6.5  ?RBC 5.03 4.09  ?HGB 14.0 11.3*  ?HCT 43.5 34.9*  ?MCV 86.5 85.3  ?MCH 27.8 27.6  ?MCHC 32.2 32.4  ?RDW 15.1 15.4  ?PLT 121* 99*  ? ?Thyroid  ?Recent Labs  ?Lab 03/09/22 ?0654  ?TSH 3.164  ?  ?  BNPNo results for input(s): BNP, PROBNP in the last 168 hours.  ?DDimer No results for input(s): DDIMER in the last 168 hours.  ? ?Radiology  ?  ?CARDIAC CATHETERIZATION ? ?Result Date: 03/09/2022 ?Images from the original result were not included.   Mid LAD lesion is 50% stenosed.   Prox RCA lesion is 100% stenosed.   A drug-eluting stent was successfully placed using a SYNERGY XD 3.50X20.   Post intervention, there is a 0% residual stenosis. Maria Holloway is a 86 y.o. female  [id number] LOCATION:  FACILITY: [facility] PHYSICIAN: Quay Burow, M.D. [birth year] DATE OF PROCEDURE:  03/09/2022 DATE OF DISCHARGE: CARDIAC CATHETERIZATION / PCI DES RCA History obtained from chart review.  86 year old married Caucasian female who lives in [place].  She has a history of hypertension, hyperlipidemia, thrombocytopenia, recent CVA several months ago and mild dementia.  She woke up early this morning, fell to the floor and hit her head.  She complained of chest pain.  She was taken to [facility] where her EKG was consistent with an anterior STEMI.  Head CT showed no bleed.   She was transported urgently for catheterization and intervention. PROCEDURE DESCRIPTION: The patient was brought to the second floor [facility]iac cath lab in the postabsorptive state.  She was not premedicated .  Her right groin was prepped and shaved in usual sterile fashion. Xylocaine 1% was used for local anesthesia. A 6 French sheath was inserted into the right common femoral artery using standard Seldinger technique.  5 French right left Judkins diagnostic catheters: 5 French pigtail catheter used for selective coronary angiography and left ventriculography respectively.  Isovue dye is used for the entirety of the case (80 cc contrast total to patient).  Retrograde aortic, ventricular and pullback pressures were recorded. The patient received 180 mg of Brilinta p.o. as well as 7500's of heparin with an ACT of 317.  Isovue dye is used for the entirety of the intervention.  Retroaortic pressures was monitored during the case. Using a 6 [place] guide catheter along with a 0.14 [place] guidewire and a 2 mm x 12 mm balloon the mid RCA was crossed with little difficulty and predilated establishing antegrade flow.  The door to balloon time was 18 minutes.  I then stented the mid RCA with a 3.5 mm x 20 mm long Synergy drug-eluting stent postdilated with a 3.75 x 15 mm balloon at 14 atm (3.75 mm) resulting in reduction of a total occlusion to 0% residual with TIMI-3 flow.  The patient transiently became hypotensive and bradycardic and responded nicely to intravenous fluids and atropine.  ? ?Successful mid RCA PCI and stenting in the setting of inferior STEMI with excellent angiographic result and a door to balloon time of 18 minutes.  I hand-injection revealed EF in the 45% range with severe inferobasal hypokinesia.  She will need to be on DAPT uninterrupted for 12 months.  A 2D echo has been ordered.  She will need high-dose statin, and beta-blocker, other medications depending on ejection fraction.  The  sheath was sewn securely in place.  She left the lab in stable condition. Quay Burow. MD, [facility] 03/09/2022 8:01 AM   ? ?ECHOCARDIOGRAM COMPLETE ? ?Result Date: 03/09/2022 ?   ECHOCARDIOGRAM REPORT   Patient Name:   MELDA MERMELSTEIN Date of Exam: 03/09/2022 Medical Rec #:  [id number]       Height: Accession #:    [id number]      Weight: Date of Birth:  [birth year]  BSA: Patient Age:    75 years        BP:           125/74 mmHg Patient Gender: F               HR:           73 bpm. Exam Location:  Inpatient Procedure: 2D Echo, Cardiac Doppler and Color Doppler Indications:    CAD Native Vessel I25.10  History:        Patient has prior history of Echocardiogram examinations, most                 recent 01/15/2022. CAD; Risk Factors:Hypertension and                 Dyslipidemia. Acute ST elevation myocardial infarction (STEMI)                 of inferior wall ([place]).  Sonographer:    Alvino Chapel RCS Referring Phys: [phone] [place] Comments: Patient became Very agitated and confused. Patient also could not follow instructions to "sniff". IMPRESSIONS  1. Left ventricular ejection fraction, by estimation, is 55 to 60%. Left ventricular ejection fraction by PLAX is 59 %. The left ventricle has normal function. The left ventricle demonstrates regional wall motion abnormalities (see scoring diagram/findings for description). Left ventricular diastolic parameters are consistent with Grade I diastolic dysfunction (impaired relaxation). There is moderate hypokinesis of the left ventricular, basal inferior wall.  2. Right ventricular systolic function is normal. The right ventricular size is normal. There is normal pulmonary artery systolic pressure. The estimated right ventricular systolic pressure is 19.0 mmHg.  3. The mitral valve is abnormal. Mild mitral valve regurgitation.  4. The aortic valve is tricuspid. Aortic valve regurgitation is trivial. Aortic valve sclerosis/calcification is present, without any  evidence of aortic stenosis. Aortic regurgitation PHT measures 462 msec.  5. The inferior vena cava is normal in size with <50% respiratory variability, suggesting right atrial pressure of 8 mmHg. Comparison(s):

## 2022-03-10 NOTE — Assessment & Plan Note (Signed)
Per cardiology. Patient tolerated PCI/Stent well and is hemodynamically stable ? ?Plan Per cardiology ?

## 2022-03-10 NOTE — TOC Benefit Eligibility Note (Signed)
Patient Advocate Encounter ? ?Insurance verification completed.   ? ?The patient is currently admitted and upon discharge could be taking [place] or [place]. ? ?The current 30 day co-pay is $120.  ? ?The patient is insured through [employer]  ? ? ? ?Mary[place] Pink, CPhT ?Pharmacy Patient Advocate Specialist ?[facility] Patient Advocate Team ?Direct Number: [phone]  Fax: [phone]  ?

## 2022-03-10 NOTE — Plan of Care (Signed)

## 2022-03-10 NOTE — Assessment & Plan Note (Signed)
Patient with h/o dementia (multi-infarct type?) with episode in December with UTI, episode with CVA February '23. She had a consultation with neurology in [place] > 1 year ago and was started on Donezipril and Sertraline. She presents with STEMI and is 1 day s/p PCE/Stent with on set of agitation and increased disorientation. Suspect delerium superimposed on dementia. ? ?Plan  U/A - catherized ? Ativan 5 mg q 6 PO or 2 mg IV q6, hold for sedation ? Haldoperidol 2 mg for severe agitation that poses a danger to patient or staff. Discussed with spouse ? Reorientation ? Bowel care ? for lack of improvement at 24 hours consider neuro-imaging for new stroke and/or neuro consult ?

## 2022-03-10 NOTE — Consult Note (Signed)
Initial Consultation Note ? ? ?Patient: Maria Holloway DGL:[id number] DOB: May 15, 1936 PCP: Gordan Payment., MD ?DOA: 03/09/2022 ?DOS: the patient was seen and examined on 03/10/2022 ?Primary service: Runell Gess, MD ? ?Referring physician: Dr. Hazle Coca team ?Reason for consult: management of delerium superimposed on dementia ? ?Assessment/Plan: ?Assessment and Plan: ?Delirium superimposed on dementia ?Patient with h/o dementia (multi-infarct type?) with episode in December with UTI, episode with CVA February '23. She had a consultation with neurology in [place] > 1 year ago and was started on Donezipril and Sertraline. She presents with STEMI and is 1 day s/p PCE/Stent with on set of agitation and increased disorientation. Suspect delerium superimposed on dementia. ? ?Plan  U/A - catherized ? Ativan 5 mg q 6 PO or 2 mg IV q6, hold for sedation ? Haldoperidol 2 mg for severe agitation that poses a danger to patient or staff. Discussed with spouse ? Reorientation ? Bowel care ? for lack of improvement at 24 hours consider neuro-imaging for new stroke and/or neuro consult ? ?Hyperlipidemia ?Initial lab reveals good control ? ?Essential hypertension ?BP mildly elevated. Management per cardiology ? ?Fall ?Superficial injury with periorbital ecchymosis and swelling. Initiatial CT negative. ? ?Plan  For persistent delerium or mental status decline - STAT CT ? ?Acute ST elevation myocardial infarction (STEMI) of inferior wall (HCC) ?Per cardiology. Patient tolerated PCI/Stent well and is hemodynamically stable ? ?Plan Per cardiology ? ? ? ? ? ? ?TRH will continue to follow the patient. ? ?HPI: Maria Holloway is a 86 y.o. female with past medical history of HTN, HLD, CVA Feb '23, dementia w/ agitation Dec /22 and Feb /23. She was in her usual state of health until the night of 03/09/22 when she fell out of bed and struck her head, also c/o chest pain. She was taken to [facility]. CT head clear for  traumatic injury or bleed. ECK and enzyme c/w acute STEMI. Patient transferred to [facility] to the cath lab for emerency intervention with PCI/STent RCA. She did well. ON post-op day #1 she developed mild agitated delerium requiring an emergent dose of diazepam 5 mg. After several hours she was recurrently agitation. TRH called to assis with management ? ?Review of Systems: As mentioned in the history of present illness. All other systems reviewed and are negative. ? ? ?Past Medical History:  ?Diagnosis Date  ? Cerebral atherosclerosis   ? Essential hypertension   ? Falls   ? GERD (gastroesophageal reflux disease)   ? Hyperlipidemia   ? Mild cognitive impairment   ? Osteoporosis   ? Stroke [facility])   ? Thrombocytopenia (HCC)   ? Vitamin B12 deficiency   ? ?Past Surgical History:  ?Procedure Laterality Date  ? CORONARY/GRAFT ACUTE MI REVASCULARIZATION N/A 03/09/2022  ? Procedure: Coronary/Graft Acute MI Revascularization;  Surgeon: Runell Gess, MD;  Location: [facility] INVASIVE CV LAB;  Service: Cardiovascular;  Laterality: N/A;  ? LEFT HEART CATH AND CORONARY ANGIOGRAPHY N/A 03/09/2022  ? Procedure: LEFT HEART CATH AND CORONARY ANGIOGRAPHY;  Surgeon: Runell Gess, MD;  Location: [facility] INVASIVE CV LAB;  Service: Cardiovascular;  Laterality: N/A;  ? ?Social History: Married 50 years. NO children. Worked as an Airline pilot for [employer]. Lives with husband and has been I-ADLs even after CVA. Spouse is devoted caretaker   reports that she has never smoked. She has never used smokeless tobacco. She reports current alcohol use. She reports that she does not use drugs. ? ?No Known Allergies ? ?Family History  ?  Problem Relation Age of Onset  ? CAD Father   ? ? ?Prior to Admission medications   ?Medication Sig Start Date End Date Taking? Authorizing Provider  ?alendronate (FOSAMAX) 70 MG tablet Take 70 mg by mouth once a week. 12/16/21  Yes [provider]  ?aspirin EC 81 MG tablet Take 81 mg by mouth daily. Swallow whole.   Yes  [provider]  ?atorvastatin (LIPITOR) 80 MG tablet Take 80 mg by mouth at bedtime. 02/11/22  Yes [provider]  ?cyanocobalamin 1000 MCG tablet Take 1,000 mcg by mouth daily.   Yes [provider]  ?donepezil (ARICEPT) 5 MG tablet Take 5 mg by mouth daily. 02/25/22  Yes [provider]  ?losartan (COZAAR) 50 MG tablet Take 50 mg by mouth daily. 02/16/22  Yes [provider]  ?omeprazole (PRILOSEC) 20 MG capsule Take 20 mg by mouth daily. 12/23/21  Yes [provider]  ?sertraline (ZOLOFT) 50 MG tablet Take 50 mg by mouth daily. 01/27/22  Yes [provider]  ? ? ?Physical Exam: ?Vitals:  ? 03/10/22 1000 03/10/22 1100 03/10/22 1200 03/10/22 1300  ?BP: (!) 171/106 (!) 155/110  (!) 141/106  ?Pulse: 88 (!) 187 (!) 213 (!) 188  ?Resp: 20 (!) 24 (!) 22 (!) 23  ?Temp:      ?TempSrc:      ?SpO2: (!) 87% 93% 100% 100%  ?Weight:      ? ?Vitals and nursing notes reviewed ?Gen'l - elderly woman in bed in wrist restraints in no acute distress but agitated. ?HEENT - periorbital swelling L<R with ecchymosis. Small laceration bridge of nose. Teeth intact. C&S clear. MM moist ?Neck - supple w/o thyromegaly, ?Chest - w/o deformity ?Pul - nl respirations, w/o rales, wheeze, rhonchi ?Cor- 2+ redial pulse right and DP ?Abd - BS +, soft mild tenderness suprapubic region w/o rebound ?Genitalia - deferred ?Ext - w/o deformity ?Neuro. - nl facial symmetry. Pupils equal. Extra-ocular mm testing hindered by poor understanding of command but both eyes moved equally w/o strabismus. Muscle strength grossly normal and moved to command. ?Derm - Ecchymosis to facial area.  ? ?Data Reviewed:  ? ?Labs reviewed: glucsoe 129, WBC 5.5,,HGB 11.3. EKG 0700 03/10/20 Sinus Rhnythm, inferior ST elevtion. ECHO - EF 55-6-%, regional wall motion abnormality noted, Grade I DDD ? ? ?Family Communication: husband present and contributed much of her history ?Primary team communication: per note. ?Thank  you very much for involving [place] in the care of your patient. ? ?Author: ?Illene Regulus, MD ?03/10/2022 2:47 PM ? ?For on call review [email or web address].  ?

## 2022-03-11 ENCOUNTER — Other Ambulatory Visit: Payer: Self-pay

## 2022-03-11 DIAGNOSIS — E782 Mixed hyperlipidemia: Secondary | ICD-10-CM

## 2022-03-11 DIAGNOSIS — I2119 ST elevation (STEMI) myocardial infarction involving other coronary artery of inferior wall: Principal | ICD-10-CM

## 2022-03-11 MED ORDER — LIP MEDEX EX OINT
TOPICAL_OINTMENT | CUTANEOUS | Status: DC | PRN
  Filled 2022-03-11 (×2): qty 7

## 2022-03-11 NOTE — Progress Notes (Signed)
?Cardiology Progress Note  ?Patient ID: Maria Holloway ?MRN: [id number] ?DOB: 07/24/1936 ?Date of Encounter: 03/11/2022 ? ?Primary Cardiologist: None ? ?Subjective  ? ?Chief Complaint: none.  ? ?HPI: Less agitated this morning.  Doing well.  Hemodynamically stable.  No complaints.  Husband at the bedside and updated. ? ?ROS:  ?All other ROS reviewed and negative. Pertinent positives noted in the HPI.    ? ?Inpatient Medications  ?Scheduled Meds: ? aspirin  81 mg Oral Daily  ? atorvastatin  80 mg Oral Daily  ? Chlorhexidine Gluconate Cloth  6 each Topical Daily  ? donepezil  5 mg Oral Daily  ? heparin  5,000 Units Subcutaneous Q8H  ? LORazepam  0.5 mg Oral Q6H  ? losartan  50 mg Oral Daily  ? metoprolol tartrate  25 mg Oral BID  ? pantoprazole  40 mg Oral Daily  ? senna-docusate  1 tablet Oral QHS  ? sodium chloride flush  3 mL Intravenous Q12H  ? ticagrelor  90 mg Oral BID  ? vitamin B-12  1,000 mcg Oral Daily  ? ?Continuous Infusions: ? sodium chloride    ? ?PRN Meds: ?sodium chloride, acetaminophen, diazepam, haloperidol lactate, morphine injection, nitroGLYCERIN, ondansetron (ZOFRAN) IV, sodium chloride flush  ? ?Vital Signs  ? ?Vitals:  ? 03/11/22 0500 03/11/22 0600 03/11/22 0700 03/11/22 0800  ?BP: (!) 159/72 (!) 146/95 139/84 (!) 150/88  ?Pulse: 69 72 78 81  ?Resp:    18  ?Temp:      ?TempSrc:      ?SpO2: 97% 96% 96% 97%  ?Weight:      ? ? ?Intake/Output Summary (Last 24 hours) at 03/11/2022 0825 ?Last data filed at 03/11/2022 0600 ?Gross per 24 hour  ?Intake 250 ml  ?Output 551 ml  ?Net -301 ml  ? ? ?  03/10/2022  ?  5:34 AM  ?Last 3 Weights  ?Weight (lbs) 133 lb 6.1 oz  ?Weight (kg) 60.5 kg  ?   ? ?Telemetry  ?Overnight telemetry shows sinus rhythm in the 70s, PACs and PVCs, brief NSVT, which I personally reviewed.  ? ?ECG  ?The most recent ECG shows sinus rhythm heart rate 81, inferolateral T wave inversions, which I personally reviewed.  ? ?Physical Exam  ? ?Vitals:  ? 03/11/22 0500 03/11/22 0600 03/11/22 0700  03/11/22 0800  ?BP: (!) 159/72 (!) 146/95 139/84 (!) 150/88  ?Pulse: 69 72 78 81  ?Resp:    18  ?Temp:      ?TempSrc:      ?SpO2: 97% 96% 96% 97%  ?Weight:      ?  ?Intake/Output Summary (Last 24 hours) at 03/11/2022 0825 ?Last data filed at 03/11/2022 0600 ?Gross per 24 hour  ?Intake 250 ml  ?Output 551 ml  ?Net -301 ml  ?  ? ?  03/10/2022  ?  5:34 AM  ?Last 3 Weights  ?Weight (lbs) 133 lb 6.1 oz  ?Weight (kg) 60.5 kg  ?  There is no height or weight on file to calculate BMI.  ?General: Well nourished, well developed, in no acute distress ?Head: Significant bruising and ecchymoses noted over the orbital aspect of the skull as well as face ?Eyes: PEERLA, EOMI  ?Neck: Supple, no JVD ?Endocrine: No thryomegaly ?Cardiac: Normal S1, S2; RRR; no murmurs, rubs, or gallops ?Lungs: Clear to auscultation bilaterally, no wheezing, rhonchi or rales  ?Abd: Soft, nontender, no hepatomegaly  ?Ext: No edema, pulses 2+, R femoral cath site with 2+ pulse, bruising noted but no hematoma ?Musculoskeletal:  No deformities, BUE and BLE strength normal and equal ?Skin: Warm and dry, no rashes   ?Neuro: Alert and oriented to person, place, time, and situation, CNII-XII grossly intact, no focal deficits  ?Psych: Normal mood and affect  ? ?Labs  ?High Sensitivity Troponin:   ?Recent Labs  ?Lab 03/09/22 ?0655 03/09/22 ?1234  ?TROPONINIHS 168* >24,000*  ?   ?Cardiac EnzymesNo results for input(s): TROPONINI in the last 168 hours. No results for input(s): TROPIPOC in the last 168 hours.  ?Chemistry ?Recent Labs  ?Lab 03/09/22 ?0655 03/10/22 ?1610  ?NA 139 137  ?K 3.2* 4.1  ?CL 102 107  ?CO2 26 22  ?GLUCOSE 159* 129*  ?BUN 8 11  ?CREATININE 0.86 0.79  ?CALCIUM 8.9 8.5*  ?PROT 6.6  --   ?ALBUMIN 3.5  --   ?AST 23  --   ?ALT 17  --   ?ALKPHOS 77  --   ?BILITOT 1.1  --   ?GFRNONAA >60 >60  ?ANIONGAP 11 8  ?  ?Hematology ?Recent Labs  ?Lab 03/09/22 ?0655 03/10/22 ?9604  ?WBC 6.9 6.5  ?RBC 5.03 4.09  ?HGB 14.0 11.3*  ?HCT 43.5 34.9*  ?MCV 86.5 85.3  ?MCH  27.8 27.6  ?MCHC 32.2 32.4  ?RDW 15.1 15.4  ?PLT 121* 99*  ? ?BNPNo results for input(s): BNP, PROBNP in the last 168 hours.  ?DDimer No results for input(s): DDIMER in the last 168 hours.  ? ?Radiology  ?ECHOCARDIOGRAM COMPLETE ? ?Result Date: 03/09/2022 ?   ECHOCARDIOGRAM REPORT   Patient Name:   Maria Holloway Date of Exam: 03/09/2022 Medical Rec #:  [id number]       Height: Accession #:    [id number]      Weight: Date of Birth:  1936/08/20       BSA: Patient Age:    85 years        BP:           125/74 mmHg Patient Gender: F               HR:           73 bpm. Exam Location:  Inpatient Procedure: 2D Echo, Cardiac Doppler and Color Doppler Indications:    CAD Native Vessel I25.10  History:        Patient has prior history of Echocardiogram examinations, most                 recent 01/15/2022. CAD; Risk Factors:Hypertension and                 Dyslipidemia. Acute ST elevation myocardial infarction (STEMI)                 of inferior wall (HCC).  Sonographer:    Maria Holloway Referring Phys: [phone] Maria Holloway  Sonographer Comments: Patient became Very agitated and confused. Patient also could not follow instructions to "sniff". IMPRESSIONS  1. Left ventricular ejection fraction, by estimation, is 55 to 60%. Left ventricular ejection fraction by PLAX is 59 %. The left ventricle has normal function. The left ventricle demonstrates regional wall motion abnormalities (see scoring diagram/findings for description). Left ventricular diastolic parameters are consistent with Grade I diastolic dysfunction (impaired relaxation). There is moderate hypokinesis of the left ventricular, basal inferior wall.  2. Right ventricular systolic function is normal. The right ventricular size is normal. There is normal pulmonary artery systolic pressure. The estimated right ventricular systolic pressure is 26.3 mmHg.  3. The mitral valve is abnormal.  Mild mitral valve regurgitation.  4. The aortic valve is tricuspid. Aortic valve  regurgitation is trivial. Aortic valve sclerosis/calcification is present, without any evidence of aortic stenosis. Aortic regurgitation PHT measures 462 msec.  5. The inferior vena cava is normal in size with <50% respiratory variability, suggesting right atrial pressure of 8 mmHg. Comparison(s): Changes from prior study are noted. 01/15/2022 [facility]): LVEF 60-65%, mild LAE, mild AI, mild MR. FINDINGS  Left Ventricle: Left ventricular ejection fraction, by estimation, is 55 to 60%. Left ventricular ejection fraction by PLAX is 59 %. The left ventricle has normal function. The left ventricle demonstrates regional wall motion abnormalities. Moderate hypokinesis of the left ventricular, basal inferior wall. The left ventricular internal cavity size was normal in size. There is no left ventricular hypertrophy. Left ventricular diastolic parameters are consistent with Grade I diastolic dysfunction (impaired relaxation). Normal left ventricular filling pressure. Right Ventricle: The right ventricular size is normal. No increase in right ventricular wall thickness. Right ventricular systolic function is normal. There is normal pulmonary artery systolic pressure. The tricuspid regurgitant velocity is 2.14 m/s, and  with an assumed right atrial pressure of 8 mmHg, the estimated right ventricular systolic pressure is 26.3 mmHg. Left Atrium: Left atrial size was normal in size. Right Atrium: Right atrial size was normal in size. Pericardium: There is no evidence of pericardial effusion. Mitral Valve: The mitral valve is abnormal. There is mild thickening of the anterior and posterior mitral valve leaflet(s). Mild mitral valve regurgitation. Tricuspid Valve: The tricuspid valve is grossly normal. Tricuspid valve regurgitation is trivial. Aortic Valve: The aortic valve is tricuspid. Aortic valve regurgitation is trivial. Aortic regurgitation PHT measures 462 msec. Aortic valve sclerosis/calcification is present, without  any evidence of aortic stenosis. Pulmonic Valve: The pulmonic valve was grossly normal. Pulmonic valve regurgitation is trivial. Aorta: The aortic root and ascending aorta are structurally normal, with no evi

## 2022-03-11 NOTE — Progress Notes (Signed)
CARDIAC REHAB PHASE I  ? ?PRE:  Rate/Rhythm: 83 SR ? ?  BP: sitting 134/103 ? ?  SaO2: 96 RA ? ?MODE:  Ambulation: 30 ft  ? ?POST:  Rate/Rhythm: 100 ST ? ?  BP: sitting 155/97  ? ?  SaO2: 96 RA ? ?Pt somnolent today, mild confusion. Wants to go home. Able to slowly move to EOB and sit briefly. Stood with standby assist but legs/knees weak and "bounced" a couple times before powering all the way up. Pt able to follow commands and began walking with RW, gait belt assist, needing to turn right to walk around bed. However pt with significant weakness with small steps, dragging feet, difficulty steering RW, therefore with very wide turn and ran in to wall. Pt eventually turned enough to head toward door. At foot of bed c/o wooziness and sat for 3 min. Stood again and was able to take slightly stronger, more purposeful steps. Walked 30 ft in hall then sat in recliner and wheeled back to room. No major c/o. Asleep immediately, on chair alarm. Husband present and supportive throughout. She will need PT/OT for strengthening and d/c planning. She has a RW at home but usually uses cane if needed. She was graduating from outpatient PT before MI. ? ?Discussed with husband MI, stent, importance of Brilinta/Plavix, walking as tolerated and CRPII. Husband receptive. Will refer to [place] CRPII.  ?[id number] ? ?Ethelda Chick CES, ACSM ?03/11/2022 ?11:43 AM ? ? ? ? ?

## 2022-03-11 NOTE — Progress Notes (Signed)
? ? ? [facility] ?                                                                            ? ? ?Maria Holloway, is a 86 y.o. female, DOB - 1936/07/30, WIO:[id number] ?Admit date - 03/09/2022    ?Outpatient Primary MD for the patient is Gordan Payment., MD ? ?LOS - 2  days ? ? ? ?Brief summary  ? ?Maria Holloway is a 86 y.o. female with past medical history of HTN, HLD, CVA Feb '23, dementia w/ agitation Dec /22 and Feb /23. She was in her usual state of health until the night of 03/09/22 when she fell out of bed and struck her head, also c/o chest pain. She was taken to [facility]. CT head clear for traumatic injury or bleed. ECK and enzyme c/w acute STEMI. Patient transferred to [facility] to the cath lab for emerency intervention with PCI/STent RCA. She did well. ON post-op day #1 she developed mild agitated delerium requiring an emergent dose of diazepam 5 mg. After several hours she was recurrently agitation. TRH called to assist with management ?  ? ? ?Assessment & Plan  ? ? ?Assessment and Plan: ?Delirium superimposed on dementia ?Patient with h/o dementia (multi-infarct type?) with episode in December with UTI, episode with CVA February '23. She had a consultation with neurology in [place] > 1 year ago and was started on Donezipril and Sertraline. She presents with STEMI and is 1 day s/p PCE/Stent with on set of agitation and increased disorientation. Suspect delerium superimposed on dementia. ? ?Pt is more alert today and following commands. No agitation.  ?Work Psychologist, prison and probation services with [employer] fund[employer] and NT.  ?Hyperlipidemia ?Continue with statin.  ? ?Essential hypertension ?BP  optimal.  ? ?Fall ?Superficial injury with periorbital ecchymosis and swelling. Initiatial CT negative. ?Recommend therapy evaluations.  ?Husband wants to know when he can take her home.  ? ?Acute ST elevation myocardial infarction (STEMI) of inferior wall (HCC) ?Per cardiology. Patient tolerated PCI/Stent well and is hemodynamically  stable ? ?Continue with aspirin, Brillinta and statin,  ? ? ? ?  ? ? ? ?Antimicrobials:  ? ?Anti-infectives (From admission, onward)  ? ? None  ? ?  ? ? ? ?Medications ? ?Scheduled Meds: ? aspirin  81 mg Oral Daily  ? atorvastatin  80 mg Oral Daily  ? Chlorhexidine Gluconate Cloth  6 each Topical Daily  ? donepezil  5 mg Oral Daily  ? heparin  5,000 Units Subcutaneous Q8H  ? losartan  50 mg Oral Daily  ? metoprolol tartrate  25 mg Oral BID  ? pantoprazole  40 mg Oral Daily  ? senna-docusate  1 tablet Oral QHS  ? sodium chloride flush  3 mL Intravenous Q12H  ? ticagrelor  90 mg Oral BID  ? vitamin B-12  1,000 mcg Oral Daily  ? ?Continuous Infusions: ? sodium chloride    ? ?PRN Meds:.sodium chloride, acetaminophen, diazepam, haloperidol lactate, lip balm, morphine injection, nitroGLYCERIN, ondansetron (ZOFRAN) IV, sodium chloride flush ? ? ? ?Subjective:  ? ?Maria Holloway was seen and examined today.  No new complaints. BM this am.  ?Objective:  ? ?Vitals:  ? 03/11/22 0600 03/11/22  0700 03/11/22 0800 03/11/22 0900  ?BP: (!) 146/95 139/84 (!) 150/88 (!) 139/104  ?Pulse: 72 78 81 78  ?Resp:   18   ?Temp:   97.7 ?F (36.5 ?C)   ?TempSrc:   Oral   ?SpO2: 96% 96% 97% 95%  ?Weight:      ? ? ?Intake/Output Summary (Last 24 hours) at 03/11/2022 1151 ?Last data filed at 03/11/2022 1000 ?Gross per 24 hour  ?Intake 370 ml  ?Output 551 ml  ?Net -181 ml  ? ?Filed Weights  ? 03/10/22 0534  ?Weight: 60.5 kg  ? ? ? ?Exam ?General exam: ill appearing woman with multiple bruises over the face.  ?Respiratory system: Clear to auscultation. Respiratory effort normal. ?Cardiovascular system: S1 & S2 heard, RRR. No pedal edema.  ?Gastrointestinal system: Abdomen is nondistended, soft and nontender. Normal bowel sounds heard. ?Central nervous system: Alert and oriented to person only. No focal deficits.  ?Extremities: Symmetric 5 x 5 power. ?Skin: bruising and ecchymosis of the face on the right > left.  ?Psychiatry: Mood & affect appropriate.   ? ? ? ?Data Reviewed:  I have personally reviewed following labs and imaging studies ? ? ?CBC ?Lab Results  ?Component Value Date  ? WBC 6.5 03/10/2022  ? RBC 4.09 03/10/2022  ? HGB 11.3 (L) 03/10/2022  ? HCT 34.9 (L) 03/10/2022  ? MCV 85.3 03/10/2022  ? MCH 27.6 03/10/2022  ? PLT 99 (L) 03/10/2022  ? MCHC 32.4 03/10/2022  ? RDW 15.4 03/10/2022  ? ? ? ?Last metabolic panel ?Lab Results  ?Component Value Date  ? NA 137 03/10/2022  ? K 4.1 03/10/2022  ? CL 107 03/10/2022  ? CO2 22 03/10/2022  ? BUN 11 03/10/2022  ? CREATININE 0.79 03/10/2022  ? GLUCOSE 129 (H) 03/10/2022  ? GFRNONAA >60 03/10/2022  ? CALCIUM 8.5 (L) 03/10/2022  ? PROT 6.6 03/09/2022  ? ALBUMIN 3.5 03/09/2022  ? BILITOT 1.1 03/09/2022  ? ALKPHOS 77 03/09/2022  ? AST 23 03/09/2022  ? ALT 17 03/09/2022  ? ANIONGAP 8 03/10/2022  ? ? ?CBG (last 3)  ?No results for input(s): GLUCAP in the last 72 hours.  ? ? ?Coagulation Profile: ?Recent Labs  ?Lab 03/09/22 ?0655  ?INR 1.2  ? ? ? ?Radiology Studies: ?ECHOCARDIOGRAM COMPLETE ? ?Result Date: 03/09/2022 ?   ECHOCARDIOGRAM REPORT   Patient Name:   Maria Holloway Date of Exam: 03/09/2022 Medical Rec #:  [id number]       Height: Accession #:    [id number]      Weight: Date of Birth:  Aug 27, 1936       BSA: Patient Age:    85 years        BP:           125/74 mmHg Patient Gender: F               HR:           73 bpm. Exam Location:  Inpatient Procedure: 2D Echo, Cardiac Doppler and Color Doppler Indications:    CAD Native Vessel I25.10  History:        Patient has prior history of Echocardiogram examinations, most                 recent 01/15/2022. CAD; Risk Factors:Hypertension and                 Dyslipidemia. Acute ST elevation myocardial infarction (STEMI)  of inferior wall (HCC).  Sonographer:    Celesta Gentile RCS Referring Phys: [phone] Delton See BERRY  Sonographer Comments: Patient became Very agitated and confused. Patient also could not follow instructions to "sniff". IMPRESSIONS  1. Left  ventricular ejection fraction, by estimation, is 55 to 60%. Left ventricular ejection fraction by PLAX is 59 %. The left ventricle has normal function. The left ventricle demonstrates regional wall motion abnormalities (see scoring diagram/findings for description). Left ventricular diastolic parameters are consistent with Grade I diastolic dysfunction (impaired relaxation). There is moderate hypokinesis of the left ventricular, basal inferior wall.  2. Right ventricular systolic function is normal. The right ventricular size is normal. There is normal pulmonary artery systolic pressure. The estimated right ventricular systolic pressure is 26.3 mmHg.  3. The mitral valve is abnormal. Mild mitral valve regurgitation.  4. The aortic valve is tricuspid. Aortic valve regurgitation is trivial. Aortic valve sclerosis/calcification is present, without any evidence of aortic stenosis. Aortic regurgitation PHT measures 462 msec.  5. The inferior vena cava is normal in size with <50% respiratory variability, suggesting right atrial pressure of 8 mmHg. Comparison(s): Changes from prior study are noted. 01/15/2022 [facility]): LVEF 60-65%, mild LAE, mild AI, mild MR. FINDINGS  Left Ventricle: Left ventricular ejection fraction, by estimation, is 55 to 60%. Left ventricular ejection fraction by PLAX is 59 %. The left ventricle has normal function. The left ventricle demonstrates regional wall motion abnormalities. Moderate hypokinesis of the left ventricular, basal inferior wall. The left ventricular internal cavity size was normal in size. There is no left ventricular hypertrophy. Left ventricular diastolic parameters are consistent with Grade I diastolic dysfunction (impaired relaxation). Normal left ventricular filling pressure. Right Ventricle: The right ventricular size is normal. No increase in right ventricular wall thickness. Right ventricular systolic function is normal. There is normal pulmonary artery systolic  pressure. The tricuspid regurgitant velocity is 2.14 m/s, and  with an assumed right atrial pressure of 8 mmHg, the estimated right ventricular systolic pressure is 26.3 mmHg. Left Atrium: Left atrial size was nor

## 2022-03-11 NOTE — Progress Notes (Signed)
Pt has not voided this shift. She has been sleeping and refusing oral hydration prior to falling asleep. Upon awakening bladder scan done 250cc in bladder. Pt attempted to urinate on bedside commode without success. Denies discomfort. Oral hydration encouraged.   ?

## 2022-03-12 ENCOUNTER — Encounter (HOSPITAL_COMMUNITY): Payer: Self-pay | Admitting: Cardiovascular Disease

## 2022-03-12 ENCOUNTER — Inpatient Hospital Stay (HOSPITAL_COMMUNITY): Payer: Medicare Other

## 2022-03-12 LAB — BASIC METABOLIC PANEL
Anion gap: 9 (ref 5–15)
BUN: 14 mg/dL (ref 8–23)
CO2: 23 mmol/L (ref 22–32)
Calcium: 8.5 mg/dL — ABNORMAL LOW (ref 8.9–10.3)
Chloride: 103 mmol/L (ref 98–111)
Creatinine, Ser: 0.87 mg/dL (ref 0.44–1.00)
GFR, Estimated: >60 mL/min (ref >60)
Glucose, Bld: 114 mg/dL — ABNORMAL HIGH (ref 70–99)
Potassium: 3.7 mmol/L (ref 3.5–5.1)
Sodium: 135 mmol/L (ref 135–145)

## 2022-03-12 LAB — VITAMIN B12: Vitamin B-12: 2093 pg/mL — ABNORMAL HIGH (ref 180–914)

## 2022-03-12 LAB — AMMONIA: Ammonia: 25 umol/L (ref 9–35)

## 2022-03-12 LAB — FOLATE: Folate: 5.6 ng/mL — ABNORMAL LOW (ref >5.9)

## 2022-03-12 MED ORDER — METOPROLOL TARTRATE 25 MG PO TABS: 25.0000 mg | ORAL_TABLET | Freq: Two times a day (BID) | ORAL | 1 refills | Status: DC

## 2022-03-12 MED ORDER — NITROGLYCERIN 0.4 MG SL SUBL: 0.4000 mg | SUBLINGUAL_TABLET | SUBLINGUAL | 3 refills | Status: DC | PRN

## 2022-03-12 MED ORDER — TICAGRELOR 90 MG PO TABS: 90.0000 mg | ORAL_TABLET | Freq: Two times a day (BID) | ORAL | 11 refills | Status: DC

## 2022-03-12 MED ORDER — PANTOPRAZOLE SODIUM 40 MG PO TBEC
40.0000 mg | DELAYED_RELEASE_TABLET | Freq: Every day | ORAL | 1 refills | Status: DC
Start: 2022-03-12 — End: 2022-09-05

## 2022-03-12 MED ORDER — TICAGRELOR 90 MG PO TABS: 90.0000 mg | ORAL_TABLET | Freq: Two times a day (BID) | ORAL | 0 refills | Status: DC

## 2022-03-12 NOTE — Discharge Summary (Signed)
?Discharge Summary  ?  ?Patient ID: Maria Holloway ?MRN: [id number]; DOB: Oct 31, 1936 ? ?Admit date: 03/09/2022 ?Discharge date: 03/12/2022 ? ?PCP:  Gordan Payment., MD ?  ?[facility] Providers ?Cardiologist:  Nanetta Batty, MD --> Would ultimately want to switch to the [facility] but will make initial TOC Visit in [place] ? ?Discharge Diagnoses  ?  ?Principal Problem: ?  Acute ST elevation myocardial infarction (STEMI) of inferior wall (HCC) ?Active Problems: ?  Fall ?  Essential hypertension ?  Hyperlipidemia ?  Delirium superimposed on dementia ? ? ?Diagnostic Studies/Procedures  ?  ?LHC: 03/2022 ?  Mid LAD lesion is 50% stenosed. ?  Prox RCA lesion is 100% stenosed. ?  A drug-eluting stent was successfully placed using a SYNERGY XD [id number]. ?  Post intervention, there is a 0% residual stenosis. ? ?IMPRESSION: Successful mid RCA PCI and stenting in the setting of inferior STEMI with excellent angiographic result and a door to balloon time of 18 minutes.  I hand-injection revealed EF in the 45% range with severe inferobasal hypokinesia.  She will need to be on DAPT uninterrupted for 12 months.  A 2D echo has been ordered.  She will need high-dose statin, and beta-blocker, other medications depending on ejection fraction.  The sheath was sewn securely in place.  She left the lab in stable condition. ?  ?  ?Echo: 03/2022 ?IMPRESSIONS  ? ? ? 1. Left ventricular ejection fraction, by estimation, is 55 to 60%. Left  ?ventricular ejection fraction by PLAX is 59 %. The left ventricle has  ?normal function. The left ventricle demonstrates regional wall motion  ?abnormalities (see scoring  ?diagram/findings for description). Left ventricular diastolic parameters  ?are consistent with Grade I diastolic dysfunction (impaired relaxation).  ?There is moderate hypokinesis of the left ventricular, basal inferior  ?wall.  ? 2. Right ventricular systolic function is normal. The right ventricular  ?size is normal. There is  normal pulmonary artery systolic pressure. The  ?estimated right ventricular systolic pressure is 26.3 mmHg.  ? 3. The mitral valve is abnormal. Mild mitral valve regurgitation.  ? 4. The aortic valve is tricuspid. Aortic valve regurgitation is trivial.  ?Aortic valve sclerosis/calcification is present, without any evidence of  ?aortic stenosis. Aortic regurgitation PHT measures 462 msec.  ? 5. The inferior vena cava is normal in size with <50% respiratory  ?variability, suggesting right atrial pressure of 8 mmHg.  ?  ?History of Present Illness   ?  ?Maria Holloway is a 86 y.o. female with past medical history of HTN, HLD, anemia, thrombocytopenia, GERD and recent CVA (occurring in 01/2022) who presented to [facility] on 03/09/2022 as a transfer from [facility] for a CODE STEMI.  ? ?She initially presented to [facility] after her husband found her on the ground after suffering a fall. She reported chest pain and abdominal pain as well. CT Head showed a forehead hematoma but no acute intracranial abnormalities. Her EKG showed ST elevation along the inferior leads, therefore CODE STEMI was activated and she was transferred for an emergent cardiac catheterization.  ? ?Hospital Course  ?   ?Consultants: Hospitalist  ? ?Catheterization showed 100% stenosis along the proximal RCA which was treated with PCI/DES placement and she did have residual 50% mid-LAD stenosis. EF was estimated at 45% by cath but normal at 55-60% by echocardiogram. She was started on ASA, Brilinta and Lopressor along with being continued on PTA Atorvastatin 80mg  daily (LDL 62 this admission).  ? ?She continued to progress  well from a cardiac perspective throughout admission but did have some agitation and worsening AMS starting on 03/10/2022. The Hospitalist team was consulted to assist with management and she was started on Ativan and Haldol. Symptoms improved and she did not require further medical treatment for this beyond  03/10/2022. ? ?She did work with Cardiac Rehab and experienced significant weakness when trying to ambulate with them. PT and OT were consulted and recommended Home Health services. Case Management will follow-up with the patient and her husband on Monday to finalize this given the holiday weekend.  ? ?A TOC follow-up visit has been arranged for 03/20/2022 and a staff message has been sent to the office to arrange for a TOC Call. Will arrange initial follow-up in [place] but can perhaps switch to the [facility] after this for closer proximity. At the time of follow-up, will likely need to switch to Plavix with loading dosing after 1 month of Brilinta if this is unaffordable.  ? ? ?Did the patient have an acute coronary syndrome (MI, NSTEMI, STEMI, etc) this admission?:  Yes                              ? ?AHA/ACC Clinical Performance & Quality Measures: ?Aspirin prescribed? - Yes ?ADP Receptor Inhibitor (Plavix/Clopidogrel, Brilinta/Ticagrelor or Effient/Prasugrel) prescribed (includes medically managed patients)? - Yes ?Beta Blocker prescribed? - Yes ?High Intensity Statin (Lipitor 40-80mg  or Crestor 20-40mg ) prescribed? - Yes ?EF assessed during THIS hospitalization? - Yes ?For EF <40%, was ACEI/ARB prescribed? - Not Applicable (EF >/= 40%) ?For EF <40%, Aldosterone Antagonist (Spironolactone or Eplerenone) prescribed? - Not Applicable (EF >/= 40%) ?Cardiac Rehab Phase II ordered (including medically managed patients)? - Yes  ? ? ?The patient will be scheduled for a TOC follow up appointment in 7-10 days.  A message has been sent to the [facility] and Scheduling Pool at the office where the patient should be seen for follow up.  ?_____________ ? ?Discharge Vitals ?Blood pressure 131/81, pulse 81, temperature (!) 97.2 ?F (36.2 ?C), temperature source Oral, resp. rate 17, height 5\' 5"  (1.651 m), weight 60.4 kg, SpO2 98 %.  ?Filed Weights  ? 03/10/22 0534 03/12/22 0424  ?Weight: 60.5 kg 60.4 kg  ? ? ?Labs &  Radiologic Studies  ?  ?CBC ?Recent Labs  ?  03/10/22 ?05/10/22  ?WBC 6.5  ?HGB 11.3*  ?HCT 34.9*  ?MCV 85.3  ?PLT 99*  ? ?Basic Metabolic Panel ?Recent Labs  ?  03/10/22 ?05/10/22 03/12/22 ?05/12/22  ?NA 137 135  ?K 4.1 3.7  ?CL 107 103  ?CO2 22 23  ?GLUCOSE 129* 114*  ?BUN 11 14  ?CREATININE 0.79 0.87  ?CALCIUM 8.5* 8.5*  ? ?Liver Function Tests ?No results for input(s): AST, ALT, ALKPHOS, BILITOT, PROT, ALBUMIN in the last 72 hours. ?No results for input(s): LIPASE, AMYLASE in the last 72 hours. ?High Sensitivity Troponin:   ?Recent Labs  ?Lab 03/09/22 ?0655 03/09/22 ?1234  ?TROPONINIHS 168* >24,000*  ?  ?BNP ?Invalid input(s): POCBNP ?D-Dimer ?No results for input(s): DDIMER in the last 72 hours. ?Hemoglobin A1C ?No results for input(s): HGBA1C in the last 72 hours. ?Fasting Lipid Panel ?Recent Labs  ?  03/10/22 ?05/10/22  ?CHOL 114  ?HDL 34*  ?LDLCALC 62  ?TRIG 90  ?CHOLHDL 3.4  ? ?Thyroid Function Tests ?No results for input(s): TSH, T4TOTAL, T3FREE, THYROIDAB in the last 72 hours. ? ?Invalid input(s): FREET3 ?_____________  ? ?Disposition  ? ?  Pt is being discharged home today in good condition. ? ?Follow-up Plans & Appointments  ? ? ? Follow-up Information   ? ? Joylene GrapesMonge, Emily C, NP Follow up on 03/20/2022.   ?Specialties: Nurse Practitioner, Family Medicine ?Why: [facility]iology Hospital Follow-up on 03/20/2022 at 1:30 PM with Bernadene PersonEmily Monge, NP (works with Dr. Allyson SabalBerry). ?Contact information: ?[address] ?[place] [address] ?[phone] ? ? ?  ?  ? ? [facility].   ?Specialty: Home Health Services ?Why: HHPT/OT- referral made and pending review- someone will contact you tomorrow and let you know if they accpeted or if another [facility] agency had to be used. ?Contact information: ?[address] ?[place] [address] ?[phone] ? ? ?  ?  ? ?  ?  ? ?  ? ?Discharge Instructions   ? ? Amb Referral to Cardiac Rehabilitation   Complete by: As directed ?  ? To [place]  ? Diagnosis:  Coronary  Stents ?STEMI ?PTCA  ?  ? After initial evaluation and assessments completed: Virtual Based Care may be provided alone or in conjunction with Phase 2 Cardiac Rehab based on patient barriers.: Yes  ? Diet - low sodium heart health

## 2022-03-12 NOTE — Evaluation (Signed)
Physical Therapy Evaluation ?Patient Details ?Name: Maria Holloway ?MRN: [id number] ?DOB: [birth year] ?Today's Date: 03/12/2022 ? ?History of Present Illness ? [facility] and had c/o chest pain; CT of head clear for traumatic injury or bleed. Pt with STEMI and [facility] and stent placed. PMH - HTN, HLD, CVA Feb '23, dementia w/ agitation Dec /22 and Feb /23.  ?Clinical Impression ? Pt presents to PT with a decrease in mobility after fall and STEMI at home. Likely these medical issues have magnified her RLE weakness likely from Feb CVA. Pt did not c/o any dizziness or wooziness with me even with head turns or mobility. Recommended to pt and husband that pt return to using her walker at home instead of her cane and that husband provide stand by assist for mobility. Pt and husband verbalize understanding and are eager to return home. Recommend HHPT for further therapy.  ?   ? ?Recommendations for follow up therapy are one component of a multi-disciplinary discharge planning process, led by the attending physician.  Recommendations may be updated based on patient status, additional functional criteria and insurance authorization. ? ?Follow Up Recommendations Home health PT ? ?  ?Assistance Recommended at Discharge Frequent or constant Supervision/Assistance  ?Patient can return home with the following ? A little help with walking and/or transfers;Help with stairs or ramp for entrance;A little help with bathing/dressing/bathroom;Assistance with cooking/housework ? ?  ?Equipment Recommendations None recommended by PT  ?Recommendations for Other Services ?    ?  ?Functional Status Assessment Patient has had a recent decline in their functional status and demonstrates the ability to make significant improvements in function in a reasonable and predictable amount of time.  ? ?  ?Precautions / Restrictions Precautions ?Precautions:  Fall ?Restrictions ?Weight Bearing Restrictions: No  ? ?  ? ?Mobility ? Bed Mobility ?Overal bed mobility: Needs Assistance ?Bed Mobility: Supine to Sit, Sit to Supine ?  ?  ?Supine to sit: Supervision, HOB elevated ?Sit to supine: Supervision ?  ?General bed mobility comments: Incr time and effort but no physical assist needed ?  ? ?Transfers ?Overall transfer level: Needs assistance ?Equipment used: Rolling walker (2 wheels) ?Transfers: Sit to/from Stand ?Sit to Stand: Min guard ?  ?  ?  ?  ?  ?General transfer comment: Able to rise on first attempt with incr time. Min guard for safety ?  ? ?Ambulation/Gait ?Ambulation/Gait assistance: Min guard ?Gait Distance (Feet): 100 Feet ?Assistive device: Rolling walker (2 wheels) ?Gait Pattern/deviations: Step-through pattern, Decreased step length - right, Shuffle ?Gait velocity: decr ?Gait velocity interpretation: <1.31 ft/sec, indicative of household ambulator ?  ?General Gait Details: Assist for safety and verbal cues to stay closer to walker. Pt initially with shortened rt step length which improved with incr distance. ? ?Stairs ?  ?  ?  ?  ?  ? ?Wheelchair Mobility ?  ? ?Modified Rankin (Stroke Patients Only) ?  ? ?  ? ?Balance Overall balance assessment: Needs assistance, History of Falls ?Sitting-balance support: Feet supported ?Sitting balance-Leahy Scale: Fair ?  ?  ?Standing balance support: During functional activity, No upper extremity supported ?Standing balance-Leahy Scale: Fair ?Standing balance comment: Static standing without UE support and supervision ?  ?  ?  ?  ?  ?  ?  ?  ?  ?  ?  ?   ? ? ? ?Pertinent Vitals/Pain Pain Assessment ?Pain Assessment: No/denies pain  ? ? ?Home  Living Family/patient expects to be discharged to:: Private residence ?Living Arrangements: Spouse/significant other ?Available Help at Discharge: Available 24 hours/day ?Type of Home: House ?Home Access: Stairs to enter ?  ?Entrance Stairs-Number of Steps: 2 ?Alternate Level  Stairs-Number of Steps: flight, pt does not go upstairs ?Home Layout: Two level;Able to live on main level with bedroom/bathroom ?Home Equipment: Cane - single Librarian, academic (2 wheels) ?   ?  ?Prior Function Prior Level of Function : Needs assist;History of Falls (last six months) ?  ?  ?  ?  ?  ?  ?Mobility Comments: uses SPC intermittently, had RW but does not use it ?ADLs Comments: generally mod I, husband completes IADLs. Pt does not drive ?  ? ? ?Hand Dominance  ? Dominant Hand: Right ? ?  ?Extremity/Trunk Assessment  ? Upper Extremity Assessment ?Upper Extremity Assessment: Defer to OT evaluation ?LUE Deficits / Details: protusion on radial side of wrist. Full ROM, MMT limited due to pain. some edema and significant bruising. generally weak, slow and deliberate - but functioanl ?LUE Sensation: WNL ?LUE Coordination: WNL ?  ? ?Lower Extremity Assessment ?Lower Extremity Assessment: Generalized weakness;RLE deficits/detail ?RLE Deficits / Details: grossly 4-/5 and functionally with slight decr likely from CVA in Feb ?  ? ?Cervical / Trunk Assessment ?Cervical / Trunk Assessment: Kyphotic  ?Communication  ? Communication: No difficulties  ?Cognition Arousal/Alertness: Awake/alert ?Behavior During Therapy: Flat affect ?Overall Cognitive Status: History of cognitive impairments - at baseline ?  ?  ?  ?  ?  ?  ?  ?  ?  ?  ?  ?  ?  ?  ?  ?  ?General Comments: dementia at baseline ?  ?  ? ?  ?General Comments General comments (skin integrity, edema, etc.): Pt able to perform head turns in supine without c/o's of dizziness/wooziness. Also no c/o's of dizziness or wooziness with mobility. ? ?  ?Exercises    ? ?Assessment/Plan  ?  ?PT Assessment Patient needs continued PT services  ?PT Problem List Decreased strength;Decreased activity tolerance;Decreased balance;Decreased mobility;Decreased knowledge of use of DME ? ?   ?  ?PT Treatment Interventions DME instruction;Gait training;Stair training;Functional  mobility training;Therapeutic activities;Therapeutic exercise;Balance training;Patient/family education   ? ?PT Goals (Current goals can be found in the Care Plan section)  ?Acute Rehab PT Goals ?Patient Stated Goal: go home ?PT Goal Formulation: With patient/family ?Time For Goal Achievement: 03/26/22 ?Potential to Achieve Goals: Good ? ?  ?Frequency Min 3X/week ?  ? ? ?Co-evaluation   ?  ?  ?  ?  ? ? ?  ?AM-PAC PT "6 Clicks" Mobility  ?Outcome Measure Help needed turning from your back to your side while in a flat bed without using bedrails?: None ?Help needed moving from lying on your back to sitting on the side of a flat bed without using bedrails?: A Little ?Help needed moving to and from a bed to a chair (including a wheelchair)?: A Little ?Help needed standing up from a chair using your arms (e.g., wheelchair or bedside chair)?: A Little ?Help needed to walk in hospital room?: A Little ?Help needed climbing 3-5 steps with a railing? : A Little ?6 Click Score: 19 ? ?  ?End of Session Equipment Utilized During Treatment: Gait belt ?Activity Tolerance: Patient tolerated treatment well ?Patient left: in bed;with call bell/phone within reach;with bed alarm set;with family/visitor present ?Nurse Communication: Mobility status ?PT Visit Diagnosis: Other abnormalities of gait and mobility (R26.89);Muscle weakness (generalized) (M62.81) ?  ? ?  Time: 4497-5300 ?PT Time Calculation (min) (ACUTE ONLY): 25 min ? ? ?Charges:   PT Evaluation ?$PT Eval Moderate Complexity: 1 Mod ?PT Treatments ?$Gait Training: 8-22 mins ?  ?   ? ? ?[facility] PT ?Acute Rehabilitation Services ?Office [phone] ? ? ?Angelina Ok [facility] ?03/12/2022, 10:53 AM ? ?

## 2022-03-12 NOTE — Evaluation (Signed)
Occupational Therapy Evaluation ?Patient Details ?Name: Maria Holloway ?MRN: [id number] ?DOB: Aug 23, 1936 ?Today's Date: 03/12/2022 ? ? ?History of Present Illness Kieley Akter is a 86 y.o. female who presented to [facility] after a fall and had c/o chest pai; CT of head clear for traumatic injury or bleed. Pt with past medical history of HTN, HLD, CVA Feb '23, dementia w/ agitation Dec /22 and Feb /23.  ? ?Clinical Impression ?  ?Berna was evaluated s/p the above admission list, she uses a SPC intermittently and completes ADLs with mod I at baseline, her husband completes all IADLs. Pt lives in a 2 level home but her main bed and bath are on the first level with 2 STE. Upon arrival pt was oriented and followed all simple functional commands and answered questions appropriately. She needed mod A for bed mobility and close min G for sit<>stands with RW. Due to generalized weakness and poor activity tolerance she currently requires up to mod A for ADLs. Pt would benefit from OT acutely, and recommend d/c to home with 24/7 assist from her husband and HHOT.  ?   ? ?Recommendations for follow up therapy are one component of a multi-disciplinary discharge planning process, led by the attending physician.  Recommendations may be updated based on patient status, additional functional criteria and insurance authorization.  ? ?Follow Up Recommendations ? Home health OT  ?  ?Assistance Recommended at Discharge Frequent or constant Supervision/Assistance  ?Patient can return home with the following A little help with walking and/or transfers;A little help with bathing/dressing/bathroom;Two people to help with bathing/dressing/bathroom;Assistance with cooking/housework;Direct supervision/assist for medications management;Direct supervision/assist for financial management;Assist for transportation;Help with stairs or ramp for entrance ? ?  ?Functional Status Assessment ? Patient has had a recent decline in their  functional status and demonstrates the ability to make significant improvements in function in a reasonable and predictable amount of time.  ?Equipment Recommendations ? BSC/3in1  ?  ?Recommendations for Other Services   ? ? ?  ?Precautions / Restrictions Precautions ?Precautions: Fall ?Restrictions ?Weight Bearing Restrictions: No  ? ?  ? ?Mobility Bed Mobility ?Overal bed mobility: Needs Assistance ?Bed Mobility: Supine to Sit, Sit to Supine ?  ?  ?Supine to sit: Mod assist ?Sit to supine: Mod assist ?  ?General bed mobility comments: assist for BLE management and trunk ?  ? ?Transfers ?Overall transfer level: Needs assistance ?Equipment used: Rolling walker (2 wheels) ?Transfers: Sit to/from Stand ?Sit to Stand: Min guard ?  ?  ?  ?  ?  ?General transfer comment: no physical assist however is did take her 3x to fully stand adn steady ?  ? ?  ?Balance Overall balance assessment: Needs assistance, History of Falls ?Sitting-balance support: Feet supported ?Sitting balance-Leahy Scale: Fair ?  ?  ?Standing balance support: Bilateral upper extremity supported, During functional activity ?Standing balance-Leahy Scale: Poor ?  ?  ?  ?  ?  ?  ?  ?  ?  ?  ?  ?  ?   ? ?ADL either performed or assessed with clinical judgement  ? ?ADL Overall ADL's : Needs assistance/impaired ?Eating/Feeding: Supervision/ safety;Sitting ?  ?Grooming: Set up;Sitting ?  ?Upper Body Bathing: Min guard;Sitting ?  ?Lower Body Bathing: Moderate assistance;Sit to/from stand ?  ?Upper Body Dressing : Min guard;Sitting ?  ?Lower Body Dressing: Moderate assistance;Sit to/from stand ?  ?Toilet Transfer: Minimal assistance;Ambulation;Rolling walker (2 wheels) ?  ?Toileting- Clothing Manipulation and Hygiene: Min guard;Sitting/lateral lean ?  ?  ?  ?  Functional mobility during ADLs: Minimal assistance;Rolling walker (2 wheels) ?General ADL Comments: slow and deliberate for all tasks, does well with simple cues. required 3x for sit<>stand without physical  assist. session limited by pt feeling "woozy" VSS and pt unable to describe symtpoms further  ? ? ? ?Vision Baseline Vision/History: 1 Wears glasses ?Ability to See in Adequate Light: 0 Adequate ?Patient Visual Report: No change from baseline ?Vision Assessment?: No apparent visual deficits  ?   ?   ?   ? ?Pertinent Vitals/Pain Pain Assessment ?Pain Assessment: Faces ?Faces Pain Scale: Hurts little more ?Pain Location: L wrist ?Pain Descriptors / Indicators: Discomfort, Grimacing ?Pain Intervention(s): Limited activity within patient's tolerance, Monitored during session  ? ? ? ?Hand Dominance Right ?  ?Extremity/Trunk Assessment Upper Extremity Assessment ?Upper Extremity Assessment: LUE deficits/detail ?LUE Deficits / Details: protusion on radial side of wrist. Full ROM, MMT limited due to pain. some edema and significant bruising. generally weak, slow and deliberate - but functioanl ?LUE Sensation: WNL ?LUE Coordination: WNL ?  ?Lower Extremity Assessment ?Lower Extremity Assessment: Defer to PT evaluation ?  ?Cervical / Trunk Assessment ?Cervical / Trunk Assessment: Kyphotic ?  ?Communication Communication ?Communication: No difficulties ?  ?Cognition Arousal/Alertness: Awake/alert ?Behavior During Therapy: Flat affect ?Overall Cognitive Status: History of cognitive impairments - at baseline ?  ?  ?  ?  ?  ?  ?  ?  ?  ?  ?  ?  ?  ?  ?  ?  ?General Comments: dementia at baseline, but orientedx4. Anxious to go home. Follows all one step commands appropriate. Had c/o feelign "woozu" but unable to detail further, VSS on RA. ?  ?  ?General Comments  Pt reported feelign "woozy" once sitting and OOB, VSS on RA ? ?  ?Exercises   ?  ?Shoulder Instructions    ? ? ?Home Living Family/patient expects to be discharged to:: Private residence ?Living Arrangements: Spouse/significant other ?Available Help at Discharge: Available 24 hours/day ?Type of Home: House ?Home Access: Stairs to enter ?Entrance Stairs-Number of Steps:  2 ?  ?Home Layout: Two level;Able to live on main level with bedroom/bathroom ?Alternate Level Stairs-Number of Steps: flight, pt does not go upstairs ?  ?Bathroom Shower/Tub: Walk-in shower ?  ?Bathroom Toilet: Standard ?  ?  ?Home Equipment: Cane - single Librarian, academic (2 wheels) ?  ?  ?  ? ?  ?Prior Functioning/Environment Prior Level of Function : Needs assist;History of Falls (last six months) ?  ?  ?  ?  ?  ?  ?Mobility Comments: uses SPC intermittently, had RW but does not use it ?ADLs Comments: generally mod I, husband completes IADLs. Pt does not drive ?  ? ?  ?  ?OT Problem List: Decreased strength;Decreased range of motion;Decreased activity tolerance;Impaired balance (sitting and/or standing);Decreased cognition;Decreased safety awareness;Pain ?  ?   ?OT Treatment/Interventions: Self-care/ADL training;Therapeutic exercise;DME and/or AE instruction;Therapeutic activities;Patient/family education;Balance training  ?  ?OT Goals(Current goals can be found in the care plan section) Acute Rehab OT Goals ?Patient Stated Goal: home ?OT Goal Formulation: With patient/family ?Time For Goal Achievement: 03/26/22 ?Potential to Achieve Goals: Good ?ADL Goals ?Pt Will Perform Grooming: standing;with supervision ?Pt Will Perform Upper Body Dressing: with modified independence;sitting ?Pt Will Perform Lower Body Dressing: with supervision;sit to/from stand ?Pt Will Transfer to Toilet: with supervision;ambulating ?Additional ADL Goal #1: Pt will indeo complete bed mobility as a precursor to ADLs  ?OT Frequency: Min 2X/week ?  ? ?   ?AM-PAC  OT "6 Clicks" Daily Activity     ?Outcome Measure Help from another person eating meals?: A Little ?Help from another person taking care of personal grooming?: A Little ?Help from another person toileting, which includes using toliet, bedpan, or urinal?: A Little ?Help from another person bathing (including washing, rinsing, drying)?: A Lot ?Help from another person to put on  and taking off regular upper body clothing?: A Little ?Help from another person to put on and taking off regular lower body clothing?: A Lot ?6 Click Score: 16 ?  ?End of Session Equipment Utilized During T

## 2022-03-12 NOTE — Progress Notes (Signed)
Mobility Specialist Progress Note  ? ? 03/12/22 0931  ?Mobility  ?Activity Transferred to/from [facility]  ?Level of Assistance Minimal assist, patient does 75% or more  ?Assistive Device Front wheel walker  ?Distance Ambulated (ft) 3 ft  ?Activity Response Tolerated fair  ?$Mobility charge 1 Mobility  ? ?Once in bed, pt c/o her head hurting and feeling irritated, RN present and aware. ? ?Logan Nation ?Mobility Specialist  ?  ?

## 2022-03-12 NOTE — TOC Transition Note (Addendum)
Transition of Care (TOC) - CM/SW Discharge Note ?Marvetta Gibbons Therapist, sports, BSN ?Transitions of Care ?Unit 4E- RN Case Manager ?See Treatment Team for direct phone #  ?Weekend cross coverage ? ?Patient Details  ?Name: Maria Holloway ?MRN: [id number] ?Date of Birth: 10-Aug-1936 ? ?Transition of Care (TOC) CM/SW Contact:  ?Dahlia Client, Romeo Rabon, RN ?Phone Number: ?03/12/2022, 1:41 PM ? ? ?Clinical Narrative:    ?Pt stable for transition home today, orders placed for HHPT/OT.  ?CM in to speak with pt- however pt confused- husband at bedside to discuss [facility] and DME needs.  ?Per spouse- pt has needed DME at home- walker, cane- declines [facility] for now.  ?[place] choice offered with list provided Per CMS guidelines from medicare.gov website with star ratings (copy placed in shadow chart) - after review spouse would like to use [facility] as first choice- if unable to accept then spouse voiced that he does not have any further preference and will defer to [facility] to secure [facility] as needed.  ?Address, phone # and PCP all confirmed with spouse.  ? ?Call made to [place]- spoke with Sharyn Lull- on call RN- due to Easter holiday referral can not be reviewed until tomorrow- CM will fax orders and demographics for intake to review in the am. This CM will f/u in the am- have informed spouse someone will call him if [facility] unable to accept and provide him with info on which agency will be providing services- spouse voiced understanding.  ?UPDATE- post discharge 03/13/22- call made to [facility] - spoke w/ Sharyn Lull regarding faxed referral sent in on Sunday for HHPT/OT- per Sharyn Lull they can accept- are asking if they can add nursing to the orders- verbal given to add nursing. They will do start of care this week- referral has been accepted and they will reach out to pt/spouse to schedule.  ? ? ?Spouse to transport home ? ? ?Final next level of care: [place] ?Barriers to Discharge: No Barriers Identified ? ? ?Patient  Goals and CMS Choice ?Patient states their goals for this hospitalization and ongoing recovery are:: return home ?CMS Medicare.gov Compare Post Acute Care list provided to:: Patient Represenative (must comment) ?Choice offered to / list presented to : Patient, Spouse ? ?Discharge Placement ?  ?           ? Home w/ [facility]- referral pending.  ?  ?  ?  ? ?Discharge Plan and Services ?  ?Discharge Planning Services: CM Consult ?Post Acute Care Choice: Home Health          ?DME Arranged: N/A ?DME Agency: NA ?  ?  ?  ?HH Arranged: PT, OT ?[place] Agency: [place] [facility] ?Date [place]: 03/12/22 ?Time [place]: [id number] spoke with at [place]: Sharyn Lull ? ?Social Determinants of Health (SDOH) Interventions ?  ? ? ?Readmission Risk Interventions ? ?  03/12/2022  ?  1:40 PM  ?Readmission Risk Prevention Plan  ?Transportation Screening Complete  ?PCP or Specialist Appt within 5-7 Days Complete  ?Home Care Screening Complete  ?Medication Review (RN CM) Complete  ? ? ? ? ? ?

## 2022-03-12 NOTE — Progress Notes (Signed)
? ?Progress Note ? ?Patient Name: Maria Holloway ?Date of Encounter: 03/12/2022 ? ?[facility] Cardiologist: Nanetta BattyJonathan Berry, MD  ? ?Subjective  ? ?Breathing stable. No chest pain or palpitations. She has worked with OT this AM. Awaiting PT evaluation. Anxious to go home.  ? ?Inpatient Medications  ?  ?Scheduled Meds: ? aspirin  81 mg Oral Daily  ? atorvastatin  80 mg Oral Daily  ? donepezil  5 mg Oral Daily  ? heparin  5,000 Units Subcutaneous Q8H  ? losartan  50 mg Oral Daily  ? metoprolol tartrate  25 mg Oral BID  ? pantoprazole  40 mg Oral Daily  ? senna-docusate  1 tablet Oral QHS  ? sodium chloride flush  3 mL Intravenous Q12H  ? ticagrelor  90 mg Oral BID  ? vitamin B-12  1,000 mcg Oral Daily  ? ?Continuous Infusions: ? sodium chloride    ? ?PRN Meds: ?sodium chloride, acetaminophen, diazepam, haloperidol lactate, lip balm, morphine injection, nitroGLYCERIN, ondansetron (ZOFRAN) IV, sodium chloride flush  ? ?Vital Signs  ?  ?Vitals:  ? 03/11/22 1943 03/11/22 2139 03/11/22 2308 03/12/22 0424  ?BP: (!) 148/76 (!) 150/93 (!) 163/80 (!) 152/87  ?Pulse: 81 79 79 80  ?Resp: 18  18 17   ?Temp: 98 ?F (36.7 ?C)  97.6 ?F (36.4 ?C) (!) 97.2 ?F (36.2 ?C)  ?TempSrc: Oral  Axillary Oral  ?SpO2: 97%  98% 98%  ?Weight:    60.4 kg  ?Height:    5\' 5"  (1.651 m)  ? ? ?Intake/Output Summary (Last 24 hours) at 03/12/2022 0757 ?Last data filed at 03/11/2022 2150 ?Gross per 24 hour  ?Intake 393 ml  ?Output --  ?Net 393 ml  ? ? ?  03/12/2022  ?  4:24 AM 03/10/2022  ?  5:34 AM  ?Last 3 Weights  ?Weight (lbs) 133 lb 1.6 oz 133 lb 6.1 oz  ?Weight (kg) 60.374 kg 60.5 kg  ?   ? ?Telemetry  ?  ?NSR, HR in 70's to 80's.  - Personally Reviewed ? ?ECG  ?  ?NSR, HR 81 with TWI along the inferolateral leads.  - Personally Reviewed ? ?Physical Exam  ? ?GEN: Pleasant elderly female appearing in no acute distress. Significant ecchymosis along face.   ?Neck: No JVD ?Cardiac: RRR, no murmurs, rubs, or gallops.  ?Respiratory: Clear to auscultation  bilaterally. ?GI: Soft, nontender, non-distended  ?MS: No pitting edema. Right radial cath site stable with ecchymosis but no hematoma. Firm, bony protrusion along left wrist.  ?Neuro:  Nonfocal  ?Psych: Normal affect  ? ?Labs  ?  ?High Sensitivity Troponin:   ?Recent Labs  ?Lab 03/09/22 ?0655 03/09/22 ?1234  ?TROPONINIHS 168* >24,000*  ?   ?Chemistry ?Recent Labs  ?Lab 03/09/22 ?0655 03/10/22 ?[id number] 03/12/22 ?[id number]  ?NA 139 137 135  ?K 3.2* 4.1 3.7  ?CL 102 107 103  ?CO2 26 22 23   ?GLUCOSE 159* 129* 114*  ?BUN 8 11 14   ?CREATININE 0.86 0.79 0.87  ?CALCIUM 8.9 8.5* 8.5*  ?PROT 6.6  --   --   ?ALBUMIN 3.5  --   --   ?AST 23  --   --   ?ALT 17  --   --   ?ALKPHOS 77  --   --   ?BILITOT 1.1  --   --   ?GFRNONAA >60 >60 >60  ?ANIONGAP 11 8 9   ?  ?Lipids  ?Recent Labs  ?Lab 03/10/22 ?[id number]  ?CHOL 114  ?TRIG 90  ?HDL 34*  ?  LDLCALC 62  ?CHOLHDL 3.4  ?  ?Hematology ?Recent Labs  ?Lab 03/09/22 ?0655 03/10/22 ?1093  ?WBC 6.9 6.5  ?RBC 5.03 4.09  ?HGB 14.0 11.3*  ?HCT 43.5 34.9*  ?MCV 86.5 85.3  ?MCH 27.8 27.6  ?MCHC 32.2 32.4  ?RDW 15.1 15.4  ?PLT 121* 99*  ? ?Thyroid  ?Recent Labs  ?Lab 03/09/22 ?0654  ?TSH 3.164  ?  ?BNPNo results for input(s): BNP, PROBNP in the last 168 hours.  ?DDimer No results for input(s): DDIMER in the last 168 hours.  ? ?Radiology  ?  ?No results found. ? ?Cardiac Studies  ? ?LHC: 03/2022 ?  Mid LAD lesion is 50% stenosed. ?  Prox RCA lesion is 100% stenosed. ?  A drug-eluting stent was successfully placed using a SYNERGY XD 3.50X20. ?  Post intervention, there is a 0% residual stenosis. ? IMPRESSION: Successful mid RCA PCI and stenting in the setting of inferior STEMI with excellent angiographic result and a door to balloon time of 18 minutes.  I hand-injection revealed EF in the 45% range with severe inferobasal hypokinesia.  She will need to be on DAPT uninterrupted for 12 months.  A 2D echo has been ordered.  She will need high-dose statin, and beta-blocker, other medications depending on ejection  fraction.  The sheath was sewn securely in place.  She left the lab in stable condition. ?  ? ?Echo: 03/2022 ?IMPRESSIONS  ? ? ? 1. Left ventricular ejection fraction, by estimation, is 55 to 60%. Left  ?ventricular ejection fraction by PLAX is 59 %. The left ventricle has  ?normal function. The left ventricle demonstrates regional wall motion  ?abnormalities (see scoring  ?diagram/findings for description). Left ventricular diastolic parameters  ?are consistent with Grade I diastolic dysfunction (impaired relaxation).  ?There is moderate hypokinesis of the left ventricular, basal inferior  ?wall.  ? 2. Right ventricular systolic function is normal. The right ventricular  ?size is normal. There is normal pulmonary artery systolic pressure. The  ?estimated right ventricular systolic pressure is 26.3 mmHg.  ? 3. The mitral valve is abnormal. Mild mitral valve regurgitation.  ? 4. The aortic valve is tricuspid. Aortic valve regurgitation is trivial.  ?Aortic valve sclerosis/calcification is present, without any evidence of  ?aortic stenosis. Aortic regurgitation PHT measures 462 msec.  ? 5. The inferior vena cava is normal in size with <50% respiratory  ?variability, suggesting right atrial pressure of 8 mmHg.  ? ?Patient Profile  ?   ?86 y.o. female w/ PMH of HTN, anemia, thrombocytopenia and dementia who presented as an acute inferior STEMI after having a fall at home.  ? ?Assessment & Plan  ?  ?1. Inferior STEMI ?- Catheterization showed 100% proximal RCA stenosis which was treated with DES placement with residual 50% mid-LAD stenosis. EF reduced at 45% by catheterization but normal by echo with EF at 55-60%.  ?- Continue ASA 81mg  daily, Brilinta 90mg  BID, Atorvastatin 80mg  daily and Lopressor 25mg  BID. Her co-pay for is going to be $120 for 30 days so if this is unaffordable going forward, will need to apply for patient assistance or switch to Plavix as an outpatient.  ? ?2. Fall ?- She did suffer a fall  prior to admission and has a forehead hematoma. CT Head at [facility] showed no acute intracranial abnormalities. She has been ambulating with Cardiac Rehab but has significant weakness. PT/OT evaluations are pending. ?- She does have a bony protrusion along ger left wrist which is new since her  fall. Will order an X-ray for additional evaluation. She has swelling along the site but no reported pain.  ? ?3. HTN ?- BP has been variable from 139/76 - 163/104 within the past 24 hours. At 152/87 on most recent check but she has not yet received her AM medications. Continue Losartan 50mg  daily and Lopressor 25mg  BID for now. Can further titrate Losartan if needed but would be hesitant to at this time given her age and generalized weakness.  ?  ?4. HLD ?- FLP this admission shows total cholesterol of 114, triglycerides 90, HDL 34 and LDL 62. She has been continued on Atorvastatin 80mg  daily.  ? ?5. Dementia/Delirium ?- Overall improved. She has not received Valium or Haldol since 03/10/2022. She has been continued on PTA Aricept 5mg  daily. Hospitalist Team following.  ? ?Likely discharge later today pending PT evaluation and imaging of her left wrist. Her local pharmacy does not open until 1100 so will call again later to make sure they are open given the Easter Holiday.  ? ? ?For questions or updates, please contact [facility] ?Please consult [email or web address] for contact info under  ? ?  ?Signed, ? , PA-C  ?03/12/2022, 7:57 AM   ? ?

## 2022-03-13 ENCOUNTER — Telehealth: Payer: Self-pay | Admitting: Nurse Practitioner

## 2022-03-13 MED FILL — Verapamil HCl IV Soln 2.5 MG/ML: INTRAVENOUS | Qty: 2 | Status: AC

## 2022-03-13 NOTE — Telephone Encounter (Signed)
?  This patient has a hospital follow-up with Irving Burton on 03/20/2022 but will need a TOC call. Will likely need to review everything with her husband.  ? ?Thanks,  ?[place]  ?

## 2022-03-13 NOTE — Telephone Encounter (Signed)
Left message for pt to call.

## 2022-03-14 ENCOUNTER — Telehealth (HOSPITAL_COMMUNITY): Payer: Self-pay

## 2022-03-14 NOTE — Telephone Encounter (Signed)
LMTCB

## 2022-03-14 NOTE — Telephone Encounter (Signed)
Per phase I cardiac rehab, fax cardiac rehab referral to [facility]. °

## 2022-03-15 NOTE — Telephone Encounter (Signed)
Encounter will be closed .  3 attempts were made to contact patient 3 separate days. ?

## 2022-03-15 NOTE — Telephone Encounter (Signed)
Left message for pt to call.

## 2022-03-20 ENCOUNTER — Encounter: Payer: Self-pay | Admitting: Nurse Practitioner

## 2022-03-20 ENCOUNTER — Ambulatory Visit: Payer: Medicare Other | Admitting: Nurse Practitioner

## 2022-03-20 VITALS — BP 116/62 | HR 59 | Ht 65.0 in | Wt 132.6 lb

## 2022-03-20 DIAGNOSIS — W19XXXD Unspecified fall, subsequent encounter: Secondary | ICD-10-CM

## 2022-03-20 DIAGNOSIS — E785 Hyperlipidemia, unspecified: Secondary | ICD-10-CM | POA: Diagnosis not present

## 2022-03-20 DIAGNOSIS — I2510 Atherosclerotic heart disease of native coronary artery without angina pectoris: Secondary | ICD-10-CM | POA: Diagnosis not present

## 2022-03-20 DIAGNOSIS — G3184 Mild cognitive impairment, so stated: Secondary | ICD-10-CM

## 2022-03-20 DIAGNOSIS — I10 Essential (primary) hypertension: Secondary | ICD-10-CM

## 2022-03-20 MED ORDER — CLOPIDOGREL BISULFATE 75 MG PO TABS: ORAL_TABLET | ORAL | 3 refills | Status: DC

## 2022-03-20 NOTE — Patient Instructions (Addendum)
Medication Instructions:  Your physician recommends that you continue on your current medications as directed. Please refer to the Current Medication list given to you today.  *If you need a refill on your cardiac medications before your next appointment, please call your pharmacy*   Lab Work: NONE ordered at this time of appointment   If you have labs (blood work) drawn today and your tests are completely normal, you will receive your results only by: MyChart Message (if you have MyChart) OR A paper copy in the mail If you have any lab test that is abnormal or we need to change your treatment, we will call you to review the results.   Testing/Procedures: NONE ordered at this time of appointment     Follow-Up: At [facility], you and your health needs are our priority.  As part of our continuing mission to provide you with exceptional heart care, we have created designated Provider Care Teams.  These Care Teams include your primary Cardiologist (physician) and Advanced Practice Providers (APPs -  Physician Assistants and Nurse Practitioners) who all work together to provide you with the care you need, when you need it.  We recommend signing up for the patient portal called "MyChart".  Sign up information is provided on this After Visit Summary.  MyChart is used to connect with patients for Virtual Visits (Telemedicine).  Patients are able to view lab/test results, encounter notes, upcoming appointments, etc.  Non-urgent messages can be sent to your provider as well.   To learn more about what you can do with MyChart, go to [email or web address].    Your next appointment:   1 month(s)  The format for your next appointment:   In Person  Provider:   Emily Monge, NP        Other Instructions   Important Information About Sugar       

## 2022-03-20 NOTE — Progress Notes (Addendum)
? ? ?Office Visit  ?  ?Patient Name: Maria Holloway ?Date of Encounter: 03/20/2022 ? ?Primary Care Provider:  Gordan Payment., MD ?Primary Cardiologist:  Nanetta Batty, MD ? ?Chief Complaint  ?  ?86 year old female with a history of CAD s/p STEMI, DES-pRCA in 03/2022, hypertension, hyperlipidemia, CVA, falls, mild cognitive impairment, GERD, vitamin B12 deficiency, osteoporosis, and thrombocytopenia who presents for posthospital follow-up related to CAD. ? ?Past Medical History  ?  ?Past Medical History:  ?Diagnosis Date  ? CAD (coronary artery disease)   ? a. s/p STEMI in 03/2022 with DES to prox/mid RCA.  ? Cerebral atherosclerosis   ? Essential hypertension   ? Falls   ? GERD (gastroesophageal reflux disease)   ? Hyperlipidemia   ? Mild cognitive impairment   ? Osteoporosis   ? Stroke [facility])   ? Thrombocytopenia (HCC)   ? Vitamin B12 deficiency   ? ?Past Surgical History:  ?Procedure Laterality Date  ? CORONARY/GRAFT ACUTE MI REVASCULARIZATION N/A 03/09/2022  ? Procedure: Coronary/Graft Acute MI Revascularization;  Surgeon: Runell Gess, MD;  Location: [facility]AS[facility] CV LAB;  Service: Cardiovascular;  Laterality: N/A;  ? LEFT HEART CATH AND CORONARY ANGIOGRAPHY N/A 03/09/2022  ? Procedure: LEFT HEART CATH AND CORONARY ANGIOGRAPHY;  Surgeon: Runell Gess, MD;  Location: [facility] INVASIVE CV LAB;  Service: Cardiovascular;  Laterality: N/A;  ? ? ?Allergies ? ?No Known Allergies ? ?History of Present Illness  ?  ?86 year old female with the above past medical history including CAD s/p STEMI, DES-pRCA in 03/2022, hypertension, hyperlipidemia, CVA, falls, mild cognitive impairment, GERD, vitamin B12 deficiency, osteoporosis, and thrombocytopenia. ? ?She presented to the ED on 03/09/2022 in the setting of STEMI, fall.  She has some facial bruising but CT head was negative. She also suffered left wrist pain, but x-ray was negative. She underwent emergent cardiac catheterization which revealed mLAD 50%, pRCA 100-0% s/p DES. She  was started on DAPT with aspirin and Brilinta. She was hospitalized from 03/09/2022 to 03/12/2022.  Post-procedure course was complicated by agitation and confusion, she does have a history of dementia/memory impairment. Echocardiogram showed EF 55-60%, normal LV function, G1 DD, moderate hypokinesis of the LV, basal inferior wall, mild mitral valve regurgitation, aortic valve sclerosis without evidence of aortic stenosis. She was discharged home on 03/12/2022 in stable condition with home health PT. ? ?She presents today for follow-up accompanied by her husband.  Since her hospitalization has been stable from a cardiac standpoint.  She does report some generalized fatigue, intermittent nausea (her appetite has been poor), and mild dyspnea on exertion.  She denies symptoms concerning for angina.  Other than her mild fatigue and nausea, she denies any new concerns today. ? ?Home Medications  ?  ?Current Outpatient Medications  ?Medication Sig Dispense Refill  ? alendronate (FOSAMAX) 70 MG tablet Take 70 mg by mouth once a week.    ? aspirin EC 81 MG tablet Take 81 mg by mouth daily. Swallow whole.    ? atorvastatin (LIPITOR) 80 MG tablet Take 80 mg by mouth at bedtime.    ? cyanocobalamin 1000 MCG tablet Take 1,000 mcg by mouth daily.    ? donepezil (ARICEPT) 5 MG tablet Take 5 mg by mouth daily.    ? losartan (COZAAR) 50 MG tablet Take 50 mg by mouth daily.    ? metoprolol tartrate (LOPRESSOR) 25 MG tablet Take 1 tablet (25 mg total) by mouth 2 (two) times daily. 180 tablet 1  ? nitroGLYCERIN (NITROSTAT) 0.4 MG SL  tablet Place 1 tablet (0.4 mg total) under the tongue every 5 (five) minutes x 3 doses as needed for chest pain. 25 tablet 3  ? pantoprazole (PROTONIX) 40 MG tablet Take 1 tablet (40 mg total) by mouth daily. 90 tablet 1  ? sertraline (ZOLOFT) 50 MG tablet Take 50 mg by mouth daily.    ? ticagrelor (BRILINTA) 90 MG TABS tablet Take 1 tablet (90 mg total) by mouth 2 (two) times daily. 60 tablet 11  ? ?No current  facility-administered medications for this visit.  ?  ? ?Review of Systems  ? ?She denies chest pain, palpitations, pnd, orthopnea, v, dizziness, syncope, edema, weight gain, or early satiety. All other systems reviewed and are otherwise negative except as noted above.  ? ?Physical Exam  ?  ?VS:  BP 116/62   Pulse (!) 59   Ht 5\' 5"  (1.651 m)   Wt 132 lb 9.6 oz (60.1 kg)   SpO2 99%   BMI 22.07 kg/m?   ?GEN: Well nourished, well developed, in no acute distress. ?HEENT: normal. ?Neck: Supple, no JVD, carotid bruits, or masses. ?Cardiac: RRR, no murmurs, rubs, or gallops. No clubbing, cyanosis, edema.  Radials/DP/PT 2+ and equal bilaterally.  Right femoral cath site without bruising, bleeding or hematoma. ?Respiratory:  Respirations regular and unlabored, clear to auscultation bilaterally. ?GI: Soft, nontender, nondistended, BS + x 4. ?MS: no deformity or atrophy. ?Skin: Diffuse bruising to face left arm, left leg, and left side of body.  Warm and dry, no rash. ?Neuro:  Strength and sensation are intact. ?Psych: Normal affect. ? ?Accessory Clinical Findings  ?  ?ECG personally reviewed by me today -sinus bradycardia, 59 bpm, nonspecific ST/T wave changes- no acute changes. ? ?Lab Results  ?Component Value Date  ? WBC 6.5 03/10/2022  ? HGB 11.3 (L) 03/10/2022  ? HCT 34.9 (L) 03/10/2022  ? MCV 85.3 03/10/2022  ? PLT 99 (L) 03/10/2022  ? ?Lab Results  ?Component Value Date  ? CREATININE 0.87 03/12/2022  ? BUN 14 03/12/2022  ? NA 135 03/12/2022  ? K 3.7 03/12/2022  ? CL 103 03/12/2022  ? CO2 23 03/12/2022  ? ?Lab Results  ?Component Value Date  ? ALT 17 03/09/2022  ? AST 23 03/09/2022  ? ALKPHOS 77 03/09/2022  ? BILITOT 1.1 03/09/2022  ? ?Lab Results  ?Component Value Date  ? CHOL 114 03/10/2022  ? HDL 34 (L) 03/10/2022  ? LDLCALC 62 03/10/2022  ? TRIG 90 03/10/2022  ? CHOLHDL 3.4 03/10/2022  ?  ?Lab Results  ?Component Value Date  ? HGBA1C 5.8 (H) 03/09/2022  ? ? ?Assessment & Plan  ? ?1. CAD:  S/p STEMI, cath showed  mLAD 50%, pRCA 100-0% s/p DES. Stable with no anginal symptoms. Her husband is concerned about the cost of Brilinta going forward and is asking if it would be possible to switch to Plavix in the future.  I will forward to Dr. Allyson SabalBerry, primary cardiologist, for further recommendations.  For now, continue aspirin, Brilinta, losartan, metoprolol, Lipitor. ? ?Addendum 03/20/22: Per Dr. Allyson SabalBerry, ok to switch DAPT from ASA/Brilinta to ASA/Plavix after 1 month. Will place order for Plavix and notify patient and husband to start Plavix on 04/11/22 and stop Brilinta at that time.  ?  ?2. Hypertension: BP well controlled. Continue current antihypertensive regimen.  ? ?3. Hyperlipidemia: LDL was 62 on 03/10/2022.  Continue aspirin, Lipitor. ? ?4. H/o fall, facial bruising: CT of the head was negative. She still has significant bruising to  her face, left arm, left side, left leg.  She denies any falls since hospitalization.  She is working with home health PT. ? ?5. Mild cognitive impairment/dementia: She did have some acute delirium during her recent hospitalization, this has since resolved. She does have baseline memory impairment. Managed per PCP.  Continue Aricept.  ? ?6. Nausea: She has reported some mild nausea since she left the hospital, she states her appetite has been poor.  Encouraged increased p.o. intake as tolerated.  Continue to monitor. ? ?7. Disposition: Follow-up in 1 month. ? ?Joylene Grapes, NP ?03/20/2022, 2:12 PM ?  ?

## 2022-03-20 NOTE — Addendum Note (Signed)
Addended by: Bernadene Person C on: 03/20/2022 05:15 PM ? ? Modules accepted: Orders ? ?

## 2022-03-24 ENCOUNTER — Telehealth: Payer: Self-pay | Admitting: Cardiovascular Disease

## 2022-03-24 NOTE — Telephone Encounter (Signed)
Pt c/o medication issue: ? ?1. Name of Medication:  ? clopidogrel (PLAVIX) 75 MG tablet  ?ticagrelor (BRILINTA) 90 MG TABS tablet ? ?2. How are you currently taking this medication (dosage and times per day)?  ? ?3. Are you having a reaction (difficulty breathing--STAT)? No ? ?4. What is your medication issue? Pt's husband states that he needs clarification on these medications. He wants to make sure pt is ok to take both. Please advise ? ?

## 2022-03-24 NOTE — Telephone Encounter (Signed)
Patient's husband had a questions about Brilinta and Plavix. Per Royce Macadamia, NP 4/17 notation, patient will start plavix on May 9th, 23 and stop brilinta. Husband repeated this back in his own words. Husband will call clinic with any other questions or concerns. ?

## 2022-03-24 NOTE — Telephone Encounter (Signed)
? ?  Patient Name: Maria Holloway  ?DOB: [birth year] ?MRN: [id number] ? ?Primary Cardiologist: Nanetta Batty, MD ? ?Contacted patient's husband regarding transition from Brilinta to Plavix. Per Dr. Allyson Sabal, ok to transition from Brilinta to Plavix following 1 month of DAPT with ASA and Brilinta. Prescription has been sent for Plavix 75 mg daily. I instructed patient's husband that patient will stop Brilinta on of after 04/11/2022, and should take Plavix 300 mg loading dose on day 1, followed by Plavix 75 mg daily. Emphazied the importance of uninterrupted DAPT given recent DES. Pt's husband verbalized understanding.  ? ? ?Joylene Grapes, NP ?03/24/2022, 12:17 PM ? ? ?

## 2022-03-31 DIAGNOSIS — I3139 Other pericardial effusion (noninflammatory): Secondary | ICD-10-CM | POA: Diagnosis not present

## 2022-04-01 DIAGNOSIS — I2510 Atherosclerotic heart disease of native coronary artery without angina pectoris: Secondary | ICD-10-CM | POA: Diagnosis not present

## 2022-04-01 DIAGNOSIS — E785 Hyperlipidemia, unspecified: Secondary | ICD-10-CM | POA: Diagnosis not present

## 2022-04-01 DIAGNOSIS — G459 Transient cerebral ischemic attack, unspecified: Secondary | ICD-10-CM

## 2022-04-01 DIAGNOSIS — F0390 Unspecified dementia without behavioral disturbance: Secondary | ICD-10-CM | POA: Diagnosis not present

## 2022-04-01 DIAGNOSIS — Z955 Presence of coronary angioplasty implant and graft: Secondary | ICD-10-CM | POA: Diagnosis not present

## 2022-04-01 DIAGNOSIS — I10 Essential (primary) hypertension: Secondary | ICD-10-CM

## 2022-04-02 DIAGNOSIS — I2510 Atherosclerotic heart disease of native coronary artery without angina pectoris: Secondary | ICD-10-CM | POA: Diagnosis not present

## 2022-04-02 DIAGNOSIS — F0390 Unspecified dementia without behavioral disturbance: Secondary | ICD-10-CM | POA: Diagnosis not present

## 2022-04-02 DIAGNOSIS — E785 Hyperlipidemia, unspecified: Secondary | ICD-10-CM | POA: Diagnosis not present

## 2022-04-02 DIAGNOSIS — Z955 Presence of coronary angioplasty implant and graft: Secondary | ICD-10-CM | POA: Diagnosis not present

## 2022-04-04 ENCOUNTER — Encounter: Payer: Self-pay | Admitting: *Deleted

## 2022-04-04 ENCOUNTER — Encounter: Payer: Self-pay | Admitting: Cardiology

## 2022-04-10 DIAGNOSIS — I5032 Chronic diastolic (congestive) heart failure: Secondary | ICD-10-CM | POA: Insufficient documentation

## 2022-04-10 DIAGNOSIS — I25118 Atherosclerotic heart disease of native coronary artery with other forms of angina pectoris: Secondary | ICD-10-CM | POA: Insufficient documentation

## 2022-04-11 ENCOUNTER — Telehealth: Payer: Self-pay | Admitting: Cardiovascular Disease

## 2022-04-11 NOTE — Telephone Encounter (Signed)
Pt's husband called requesting to switch from Dr Allyson Sabal to Dr Bing Matter due to location. Pt is closer to [employer] office.  ?

## 2022-04-12 ENCOUNTER — Telehealth: Payer: Self-pay | Admitting: Nurse Practitioner

## 2022-04-12 NOTE — Telephone Encounter (Signed)
? ?  Patient Name: Maria Holloway  ?DOB: February 13, 1936 ?MRN: [id number] ? ?Primary Cardiologist: Nanetta Batty, MD ? ?Received fax from patient's PCP office with urgent lab result of severely low platelet count at 29. Patient has a history of thrombocytopenia, platelets were 99 in April 2023. Patient was advised to follow-up with hematology per her PCP. Spoke with patient's husband, patient denies bleeding.  Patient's husband had previously requested to switch antiplatelet therapy from Brilinta to Plavix. She is currently taking aspirin and Brilinta for DAPT in the setting of recent STEMI, s/p DES-RCA in 03/2022. Discussed lab results with Dr. Allyson Sabal, primary cardiologist, who recommends continuing aspirin and Brilinta for now given patient is post recent PCI.  He also recommends follow-up with hematology. ? ?Patient has requested to switch cardiologist to Dr. Bing Matter in [place] as this is closer to her home. Dr. Allyson Sabal was agreeable to this change. She has a follow-up appointment scheduled with Dr. Bing Matter on 04/17/2022.  Patient can discuss possible transition from Brilinta to Plavix at that time. Patient's husband was advised to keep this appointment. He verbalized understanding. ? ?Attempted to contact patient's PCP office to discuss Dr. Hazle Coca recommendations, no answer. ? ?Joylene Grapes, NP ?04/12/2022, 12:02 PM ? ? ?

## 2022-04-12 NOTE — Telephone Encounter (Signed)
? ?  Patient Name: Maria Holloway  ?DOB: 1936-03-19 ?MRN: [id number] ? ?Primary Cardiologist: Nanetta Batty, MD ? ?Received phone call from patient's husband stating that patient will run out of Brilinta today or tomorrow. He states he already has a prescription for Plavix as he had previously requested to use transition from Brilinta to Plavix (this was approved by Dr. Allyson Sabal). Okay for patient to transition from Brilinta to Plavix once she finishes her current prescription for Brilinta. Patient's husband verbalized understanding.  Continue to monitor for signs of bleeding and follow-up with Dr. Bing Matter as scheduled on 04/17/2022. ? ?Joylene Grapes, NP ?04/12/2022, 4:35 PM ?  ?

## 2022-04-14 ENCOUNTER — Inpatient Hospital Stay: Payer: Medicare Other | Attending: Oncology | Admitting: Hematology and Oncology

## 2022-04-14 ENCOUNTER — Inpatient Hospital Stay: Payer: Medicare Other

## 2022-04-14 ENCOUNTER — Other Ambulatory Visit: Payer: Self-pay | Admitting: Oncology

## 2022-04-14 VITALS — BP 147/74 | HR 71 | Temp 97.8°F | Resp 16 | Ht 65.0 in | Wt 133.4 lb

## 2022-04-14 DIAGNOSIS — D696 Thrombocytopenia, unspecified: Secondary | ICD-10-CM | POA: Insufficient documentation

## 2022-04-14 DIAGNOSIS — I10 Essential (primary) hypertension: Secondary | ICD-10-CM | POA: Insufficient documentation

## 2022-04-14 DIAGNOSIS — Z87891 Personal history of nicotine dependence: Secondary | ICD-10-CM | POA: Insufficient documentation

## 2022-04-14 DIAGNOSIS — M810 Age-related osteoporosis without current pathological fracture: Secondary | ICD-10-CM | POA: Insufficient documentation

## 2022-04-14 DIAGNOSIS — K219 Gastro-esophageal reflux disease without esophagitis: Secondary | ICD-10-CM | POA: Insufficient documentation

## 2022-04-14 DIAGNOSIS — D693 Immune thrombocytopenic purpura: Secondary | ICD-10-CM | POA: Insufficient documentation

## 2022-04-14 DIAGNOSIS — Z8673 Personal history of transient ischemic attack (TIA), and cerebral infarction without residual deficits: Secondary | ICD-10-CM | POA: Diagnosis not present

## 2022-04-14 DIAGNOSIS — I213 ST elevation (STEMI) myocardial infarction of unspecified site: Secondary | ICD-10-CM | POA: Insufficient documentation

## 2022-04-14 DIAGNOSIS — I517 Cardiomegaly: Secondary | ICD-10-CM | POA: Insufficient documentation

## 2022-04-14 DIAGNOSIS — E785 Hyperlipidemia, unspecified: Secondary | ICD-10-CM | POA: Diagnosis not present

## 2022-04-14 DIAGNOSIS — F03911 Unspecified dementia, unspecified severity, with agitation: Secondary | ICD-10-CM | POA: Insufficient documentation

## 2022-04-14 DIAGNOSIS — E538 Deficiency of other specified B group vitamins: Secondary | ICD-10-CM | POA: Insufficient documentation

## 2022-04-14 LAB — LACTATE DEHYDROGENASE: LDH: 189 U/L (ref 98–192)

## 2022-04-14 LAB — VITAMIN B12: Vitamin B-12: 788 pg/mL (ref 180–914)

## 2022-04-14 LAB — BASIC METABOLIC PANEL
BUN: 8 (ref 4–21)
CO2: 29 — AB (ref 13–22)
Chloride: 99 (ref 99–108)
Creatinine: 0.8 (ref 0.5–1.1)
Glucose: 122
Potassium: 3.6 mEq/L (ref 3.5–5.1)
Sodium: 136 — AB (ref 137–147)

## 2022-04-14 LAB — HEPATIC FUNCTION PANEL
ALT: 18 U/L (ref 7–35)
AST: 27 (ref 13–35)
Alkaline Phosphatase: 63 (ref 25–125)
Bilirubin, Total: 1

## 2022-04-14 LAB — FOLATE: Folate: 5.2 ng/mL — ABNORMAL LOW (ref >5.9)

## 2022-04-14 LAB — CBC AND DIFFERENTIAL
HCT: 36 (ref 36–46)
Hemoglobin: 11.4 — AB (ref 12.0–16.0)
Neutrophils Absolute: 3.98
Platelets: 31 10*3/uL — AB (ref 150–400)
WBC: 5.3

## 2022-04-14 LAB — COMPREHENSIVE METABOLIC PANEL
Albumin: 3.7 (ref 3.5–5.0)
Calcium: 8.4 — AB (ref 8.7–10.7)

## 2022-04-14 LAB — CBC: RBC: 4.72 (ref 3.87–5.11)

## 2022-04-14 MED ORDER — HALOPERIDOL 1 MG PO TABS: 1.0000 mg | ORAL_TABLET | Freq: Three times a day (TID) | ORAL | 1 refills | Status: DC | PRN

## 2022-04-14 MED ORDER — DEXAMETHASONE 4 MG PO TABS: 12.0000 mg | ORAL_TABLET | Freq: Every day | ORAL | 0 refills | Status: DC

## 2022-04-14 NOTE — Progress Notes (Signed)
[facility]  [address] [place],  [place]  [id number] [phone]  Clinic Day:  04/14/2022  Referring physician: Gordan PaymentGrisso, Greg A., MD   REASON FOR CONSULTATION:  Thrombocytopenia  HISTORY OF PRESENT ILLNESS:  Maria CalBeverly Holloway is a 86 y.o. female with dementia who has worsening thrombocytopenia.  She is referred in consultation by Dr. Feliciana RossettiGreg Grisso for assessment and management.  She has a history of CVA in February of this year, for which she was on aspirin 81 mg daily.  On April 6th, she [facility] after a fall with significant bruising of her face.  She had mild thrombocytopenia with a platelet count of 119,000.  White blood count and hemoglobin were normal.  CT head did not reveal any intracranial injury.  There was a forehead hematoma without calvarial fracture.  Chronic small vessel disease with infarcts was seen.  EKG revealed ST elevation consistent with an acute myocardial infarction.  [facility] and underwent cardiac catheterization with stenting of the right coronary artery.   Her folate level was mildly low at 5.6.  She was discharged home on aspirin 81 mg daily and Brilinta 90 mg twice daily.  She was [facility] at the [facility] again on April 12th with general weakness and her platelet count remained low at 117,000.  Her hemoglobin was mildly low at 11.7 and bilirubin was 2.  She presented to the ER again on April 15th with restlessness and anxiety, which she had been experiencing since her hospitalization for MRI.  She had persistent thrombocytopenia with a platelet count of 117,000, but normal white count and hemoglobin.  The bilirubin was 1.5.  No other specific concerns were found.  She [facility] again on April 28th with general weakness and dizziness. Her CT head did not reveal any acute abnormality.  CTA chest did not reveal acute pulmonary embolism.  There were multiple  pulmonary nodules the largest measuring 2.8 cm.  Repeat CT or PET scan was recommended.  Small bilateral pleural effusions with mild interstitial edema was seen.  She also had elevation of her BNP.  She was admitted and diuresed.  Echocardiogram revealed mild concentric left ventricular hypertrophy with overall normal left ventricular function with an ejection fraction of 55 to 60%.  She had persistent thrombocytopenia with a platelet count of 66,000 to 79,00 during her 3-day admission.  Her hemoglobin fluctuated up and down but remained above 11.  White blood count remained normal and bilirubin was normal.  She saw Dr. Shary DecampGrisso for follow-up on May 8th at which time her platelet count had dropped to 29,000, so she was referred here.  White blood cells, hemoglobin and bilirubin remained normal  Her husband accompanies her today and provides most of the history.  The patient states she simply cannot remember things anymore.  She has easy bruising.  She has not had nosebleeds, gum bleeds, hematuria, melena or hematochezia.  Her urine has not been dark.  In general, she has been improving since being in the hospital.  She has not had further falls.  She is undergoing home physical therapy and uses a cane to ambulate.    Her husband states that Dr. Allyson SabalBerry is switching her from Brilinta to Plavix due to the thrombocytopenia.  In addition to above, she has a history of hypertension and gastroesophageal reflux disease.  She is taking omeprazole over-the-counter, as well as pantoprazole.  She had a  large ovarian cyst and underwent TAH/BSO, and appendectomy.  She has had bilateral hip replacement and bilateral cataract surgery.  She has never smoked.  She has used alcohol socially in the past, but rarely drinks now.  She denies personal or family history of malignancy.  REVIEW OF SYSTEMS:  Review of Systems  Constitutional:  Negative for appetite change, chills, fatigue, fever and unexpected weight change.  HENT:    Negative for lump/mass, mouth sores and sore throat.   Respiratory:  Negative for cough and shortness of breath.   Cardiovascular:  Negative for chest pain and leg swelling.  Gastrointestinal:  Negative for abdominal pain, constipation, diarrhea, nausea and vomiting.  Endocrine: Negative for hot flashes.  Genitourinary:  Negative for difficulty urinating, dysuria, frequency and hematuria.   Musculoskeletal:  Positive for gait problem (general weakness, walks with cane). Negative for arthralgias, back pain and myalgias.  Skin:  Negative for rash.  Neurological:  Positive for gait problem (general weakness, walks with cane). Negative for dizziness and headaches.  Hematological:  Negative for adenopathy. Bruises/bleeds easily (Easy bruising).  Psychiatric/Behavioral:  Negative for depression and sleep disturbance. The patient is not nervous/anxious.     VITALS:  Blood pressure (!) 147/74, pulse 71, temperature 97.8 F (36.6 C), resp. rate 16, height 5\' 5"  (1.651 m), weight 133 lb 6.4 oz (60.5 kg), SpO2 96 %.  Wt Readings from Last 3 Encounters:  04/17/22 135 lb 9.6 oz (61.5 kg)  04/14/22 133 lb 6.4 oz (60.5 kg)  03/28/22 132 lb (59.9 kg)    Body mass index is 22.2 kg/m.  Performance status (ECOG): 2 - Symptomatic, <50% confined to bed  PHYSICAL EXAM:  Physical Exam Vitals and nursing note reviewed.  Constitutional:      General: She is not in acute distress.    Appearance: Normal appearance.  HENT:     Head: Normocephalic.     Comments: Resolving ecchymosis of almost the entire face.    Mouth/Throat:     Mouth: Mucous membranes are moist.     Pharynx: Oropharynx is clear. No oropharyngeal exudate or posterior oropharyngeal erythema.  Eyes:     General: No scleral icterus.    Extraocular Movements: Extraocular movements intact.     Conjunctiva/sclera: Conjunctivae normal.     Pupils: Pupils are equal, round, and reactive to light.  Cardiovascular:     Rate and Rhythm: Normal  rate and regular rhythm.     Heart sounds: Normal heart sounds. No murmur heard.   No friction rub. No gallop.  Pulmonary:     Effort: Pulmonary effort is normal.     Breath sounds: Normal breath sounds. No wheezing, rhonchi or rales.  Abdominal:     General: There is no distension.     Palpations: Abdomen is soft. There is no hepatomegaly, splenomegaly or mass.     Tenderness: There is no abdominal tenderness.  Musculoskeletal:        General: Normal range of motion.     Cervical back: Normal range of motion and neck supple. No tenderness.     Right lower leg: No edema.     Left lower leg: No edema.  Lymphadenopathy:     Cervical: No cervical adenopathy.     Upper Body:     Right upper body: No supraclavicular or axillary adenopathy.     Left upper body: No supraclavicular or axillary adenopathy.     Lower Body: No right inguinal adenopathy. No left inguinal adenopathy.  Skin:  General: Skin is warm and dry.     Coloration: Skin is not jaundiced.     Findings: Bruising (Multiple scattered ecchymoses in various stages of healing) present. No rash.  Neurological:     Mental Status: She is alert and oriented to person, place, and time.     Cranial Nerves: No cranial nerve deficit.  Psychiatric:        Mood and Affect: Mood normal.        Behavior: Behavior normal.        Thought Content: Thought content normal.     LABS:      Latest Ref Rng & Units 04/14/2022   12:00 AM 03/10/2022   12:47 AM 03/09/2022    6:55 AM  CBC  WBC  9.5      6.5   6.9    Hemoglobin 12.0 - 16.0 13.9      11.3   14.0    Hematocrit 36 - 46 43      34.9   43.5    Platelets 150 - 400 K/uL 174      99   121       This result is from an external source.      Latest Ref Rng & Units 04/14/2022   12:00 AM 03/12/2022    3:37 AM 03/10/2022   12:47 AM  CMP  Glucose 70 - 99 mg/dL  858   850    BUN 4 - 21 18      14   11     Creatinine 0.5 - 1.1 0.7      0.87   0.79    Sodium 137 - 147 140      135   137     Potassium 3.5 - 5.1 mEq/L 4.0      3.7   4.1    Chloride 99 - 108 105      103   107    CO2 13 - 22 26      23   22     Calcium 8.7 - 10.7 9.3      8.5   8.5    Alkaline Phos 25 - 125 100         AST 13 - 35 27         ALT 7 - 35 U/L 20            This result is from an external source.   Review of the peripheral smear by Dr. revealed decreased platelets, occasional large platelets, occasional platelet fragments, poikilocytosis and occasional elliptocytes and microcytes.   No results found for: CEA1 / No results found for: CEA1 No results found for: PSA1 No results found for: No results found for: CAN125  No results found for: TOTALPROTELP, ALBUMINELP, A1GS, A2GS, BETS, BETA2SER, GAMS, MSPIKE, SPEI No results found for: TIBC, FERRITIN, IRONPCTSAT Lab Results  Component Value Date   LDH 189 04/14/2022    STUDIES:  No results found.    HISTORY:   Past Medical History:  Diagnosis Date   Acute ST elevation myocardial infarction (STEMI) of inferior wall (HCC) 03/09/2022   Age-related osteoporosis without current pathological fracture 04/20/2021   Formatting of this note might be different from the original. Dexa 04/2021. [facility]   Anxiety disorder 03/23/2021   Formatting of this note might be different from the original. Tried and failed escitalopram   At high risk for falls 01/23/2022   CAD (coronary artery disease)  a. s/p STEMI in 03/2022 with DES to prox/mid RCA.   Cerebral atherosclerosis    Delirium superimposed on dementia 03/10/2022   Essential hypertension    Fall 03/09/2022   GERD without esophagitis 08/18/2021   History of arterial ischemic stroke 01/23/2022   Formatting of this note might be different from the original. 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/2021   Nocturia 09/26/2021   Osteoporosis    Primary osteoarthritis  involving multiple joints 08/18/2021   Seborrheic keratosis 09/26/2021   Stroke (HCC)    Thrombocytopenia (HCC)    Vitamin B12 deficiency     Past Surgical History:  Procedure Laterality Date   CORONARY/GRAFT ACUTE MI REVASCULARIZATION N/A 03/09/2022   Procedure: Coronary/Graft Acute MI Revascularization;  Surgeon: Runell Gess, MD;  Location: [facility] INVASIVE CV LAB;  Service: Cardiovascular;  Laterality: N/A;   LEFT HEART CATH AND CORONARY ANGIOGRAPHY N/A 03/09/2022   Procedure: LEFT HEART CATH AND CORONARY ANGIOGRAPHY;  Surgeon: Runell Gess, MD;  Location: [facility] INVASIVE CV LAB;  Service: Cardiovascular;  Laterality: N/A;    Family History  Problem Relation Age of Onset   Stroke Mother    CAD Father    Heart attack Father     Social History:  reports that she has never smoked. She has never used smokeless tobacco. She reports current alcohol use. She reports that she does not use drugs.    She is married with no children.  The patient is accompanied by her husband today.  Allergies: No Known Allergies  Current Medications: Current Outpatient Medications  Medication Sig Dispense Refill   dexamethasone (DECADRON) 4 MG tablet Take 3 tablets (12 mg total) by mouth daily. In the morning with breakfast 15 tablet 0   haloperidol (HALDOL) 1 MG tablet Take 1 tablet (1 mg total) by mouth every 8 (eight) hours as needed for agitation. 60 tablet 1   aspirin EC 81 MG tablet Take 81 mg by mouth daily. Swallow whole.     atorvastatin (LIPITOR) 80 MG tablet Take 80 mg by mouth at bedtime.     cyanocobalamin 1000 MCG tablet Take 1,000 mcg by mouth daily.     furosemide (LASIX) 20 MG tablet Take 20 mg by mouth as needed for fluid or edema.     losartan (COZAAR) 50 MG tablet Take 25 mg by mouth daily.     metoprolol tartrate (LOPRESSOR) 25 MG tablet Take 1 tablet (25 mg total) by mouth 2 (two) times daily. 180 tablet 1   nitroGLYCERIN (NITROSTAT) 0.4 MG SL tablet Place 1 tablet (0.4 mg total)  under the tongue every 5 (five) minutes x 3 doses as needed for chest pain. 25 tablet 3   pantoprazole (PROTONIX) 40 MG tablet Take 1 tablet (40 mg total) by mouth daily. 90 tablet 1   sertraline (ZOLOFT) 50 MG tablet Take 50 mg by mouth daily.     No current facility-administered medications for this visit.     ASSESSMENT & PLAN:   Assessment:  Maria Holloway is a 86 y.o. female with slowly worsening thrombocytopenia most consistent with immune thrombocytopenic purpura.   The treatment is corticosteroids but we are concerned about giving her high doses for an extended period of time due to her dementia and agitation.  Her folic acid level remains low at 5.2 today.  The folate deficiency can contribute to the thrombocytopenia,  but is not likely the primary cause.  Brilinta has been shown to cause TTP on occasion, but she does not have evidence of hemolytic anemia or renal insufficiency.  There is the potential that the thrombocytopenia could be due to myelofibrosis or myelodysplasia, so if she does not have improvement with the corticosteroids, we will need to consider bone marrow evaluation.  B12 is normal.  Serum protein electrophoresis is pending from today.  She is on both omeprazole and pantoprazole.  Plan: 1.  Pulse dexamethasone 12 mg daily for 5 days to treat the presumed ITP. 2.  Haldol 2 mg every 8 hours as needed for agitation. 3.  Folic acid daily. 4.  I advised discontinuing omeprazole and continuing pantoprazole.  I discussed the assessment and treatment plan with the patient and her husband.  They were provided an opportunity to ask questions and all were answered.  They agreed with the plan and demonstrated an understanding of the instructions, however, the patient is unlikely to retain the information and will rely on her husband to carry out the instructions.  Her husband was advised to call back if the symptoms worsen or if the condition fails to improve as anticipated.    The  patient was staffed and plan formulated with Dr. Gilman Buttner.  Thank you for the opportunity to care for this lovely woman.   I provided 60 minutes of face-to-face time during this encounter and > 50% was spent counseling as documented under my assessment and plan.    Adah Perl, PA-C

## 2022-04-17 ENCOUNTER — Encounter: Payer: Self-pay | Admitting: Hematology and Oncology

## 2022-04-17 ENCOUNTER — Ambulatory Visit: Payer: Medicare Other | Admitting: Cardiology

## 2022-04-17 ENCOUNTER — Encounter: Payer: Self-pay | Admitting: Cardiology

## 2022-04-17 VITALS — BP 120/68 | HR 52 | Ht 65.0 in | Wt 135.6 lb

## 2022-04-17 DIAGNOSIS — I2119 ST elevation (STEMI) myocardial infarction involving other coronary artery of inferior wall: Secondary | ICD-10-CM

## 2022-04-17 DIAGNOSIS — I10 Essential (primary) hypertension: Secondary | ICD-10-CM

## 2022-04-17 DIAGNOSIS — I25118 Atherosclerotic heart disease of native coronary artery with other forms of angina pectoris: Secondary | ICD-10-CM

## 2022-04-17 DIAGNOSIS — I2510 Atherosclerotic heart disease of native coronary artery without angina pectoris: Secondary | ICD-10-CM

## 2022-04-17 DIAGNOSIS — I5032 Chronic diastolic (congestive) heart failure: Secondary | ICD-10-CM

## 2022-04-17 NOTE — Patient Instructions (Signed)
Medication Instructions:  ?Your physician recommends that you continue on your current medications as directed. Please refer to the Current Medication list given to you today.  ?*If you need a refill on your cardiac medications before your next appointment, please call your pharmacy* ? ? ?Lab Work: ?BMP, PRO BNP- Today ?If you have labs (blood work) drawn today and your tests are completely normal, you will receive your results only by: ?MyChart Message (if you have MyChart) OR ?A paper copy in the mail ?If you have any lab test that is abnormal or we need to change your treatment, we will call you to review the results. ? ? ?Testing/Procedures: ?None Ordered ? ? ?Follow-Up: ?At [facility], you and your health needs are our priority.  As part of our continuing mission to provide you with exceptional heart care, we have created designated Provider Care Teams.  These Care Teams include your primary Cardiologist (physician) and Advanced Practice Providers (APPs -  Physician Assistants and Nurse Practitioners) who all work together to provide you with the care you need, when you need it. ? ?We recommend signing up for the patient portal called "MyChart".  Sign up information is provided on this After Visit Summary.  MyChart is used to connect with patients for Virtual Visits (Telemedicine).  Patients are able to view lab/test results, encounter notes, upcoming appointments, etc.  Non-urgent messages can be sent to your provider as well.   ?To learn more about what you can do with MyChart, go to [email or web address].   ? ?Your next appointment:   ?2 month(s) ? ?The format for your next appointment:   ?In Person ? ?Provider:   ?Gypsy Balsam, MD  ? ? ?Other Instructions ?NA  ?

## 2022-04-17 NOTE — Progress Notes (Signed)
?Cardiology Office Note:   ? ?Date:  04/17/2022  ? ?ID:  Maria Holloway, DOB 12-19-1935, MRN [id number] ? ?PCP:  Gordan PaymentGrisso, Greg A., MD  ?Cardiologist:  Gypsy Balsamobert Efrem Pitstick, MD   ? ?Referring MD: Gordan PaymentGrisso, Greg A., MD  ? ?Chief Complaint  ?Patient presents with  ? myocardial infarction  ? Edema  ?  Weight gain on Lasix   ? ? ?History of Present Illness:   ? ?Maria Holloway is a 86 y.o. female with past medical history significant for coronary artery disease in April 2023 she end up coming to the hospital with an STEMI she had complete occlusion midportion of the RCA which was addressed with drug-eluting stent, also essential hypertension, dyslipidemia, history of CVA, falls, dementia, GERD, vitamin B12 deficiency, osteoporosis, transferred to cytopenia.  Recently she ended up being read[facility] because of shortness of breath.  She was appropriately managed with aggressive diuresis comes to my office for follow-up.  She talks however she does not remember details her husband is with her in the room and majority of conversation was between [place] and her husband.  She denies have any chest pain tightness squeezing pressure burning chest there is some swelling of lower extremities as well as bruising however does bruising after a fall that she sustained when she was having acute heart attack.  Overall she does not do much she sits in the chair majority of time. ? ?Past Medical History:  ?Diagnosis Date  ? Acute ST elevation myocardial infarction (STEMI) of inferior wall (HCC) 03/09/2022  ? Age-related osteoporosis without current pathological fracture 04/20/2021  ? Formatting of this note might be different from the original. Dexa 04/2021. [facility]  ? Anxiety disorder 03/23/2021  ? Formatting of this note might be different from the original. Tried and failed escitalopram  ? At high risk for falls 01/23/2022  ? CAD (coronary artery disease)   ? a. s/p STEMI in 03/2022 with DES to prox/mid RCA.  ? Cerebral  atherosclerosis   ? Delirium superimposed on dementia 03/10/2022  ? Essential hypertension   ? Fall 03/09/2022  ? GERD without esophagitis 08/18/2021  ? History of arterial ischemic stroke 01/23/2022  ? Formatting of this note might be different from the original. 01/2022  ? Hyperlipidemia   ? Malaise and fatigue 03/23/2021  ? Mild cognitive impairment   ? Mild episode of recurrent major depressive disorder (HCC) 09/26/2021  ? Nocturia 09/26/2021  ? Osteoporosis   ? Primary osteoarthritis involving multiple joints 08/18/2021  ? Seborrheic keratosis 09/26/2021  ? Stroke [facility](HCC)   ? Thrombocytopenia (HCC)   ? Vitamin B12 deficiency   ? ? ?Past Surgical History:  ?Procedure Laterality Date  ? CORONARY/GRAFT ACUTE MI REVASCULARIZATION N/A 03/09/2022  ? Procedure: Coronary/Graft Acute MI Revascularization;  Surgeon: Runell GessBerry, Jonathan J, MD;  [facility];  Service: Cardiovascular;  Laterality: N/A;  ? LEFT HEART CATH AND CORONARY ANGIOGRAPHY N/A 03/09/2022  ? Procedure: LEFT HEART CATH AND CORONARY ANGIOGRAPHY;  Surgeon: Runell GessBerry, Jonathan J, MD;  Location: [facility] INVASIVE CV LAB;  Service: Cardiovascular;  Laterality: N/A;  ? ? ?Current Medications: ?Current Meds  ?Medication Sig  ? aspirin EC 81 MG tablet Take 81 mg by mouth daily. Swallow whole.  ? atorvastatin (LIPITOR) 80 MG tablet Take 80 mg by mouth at bedtime.  ? cyanocobalamin 1000 MCG tablet Take 1,000 mcg by mouth daily.  ? dexamethasone (DECADRON) 4 MG tablet Take 3 tablets (12 mg total) by mouth daily. In the morning  with breakfast  ? furosemide (LASIX) 20 MG tablet Take 20 mg by mouth as needed for fluid or edema.  ? haloperidol (HALDOL) 1 MG tablet Take 1 tablet (1 mg total) by mouth every 8 (eight) hours as needed for agitation.  ? losartan (COZAAR) 50 MG tablet Take 25 mg by mouth daily.  ? metoprolol tartrate (LOPRESSOR) 25 MG tablet Take 1 tablet (25 mg total) by mouth 2 (two) times daily.  ? nitroGLYCERIN (NITROSTAT) 0.4 MG SL tablet Place 1 tablet  (0.4 mg total) under the tongue every 5 (five) minutes x 3 doses as needed for chest pain.  ? pantoprazole (PROTONIX) 40 MG tablet Take 1 tablet (40 mg total) by mouth daily.  ? sertraline (ZOLOFT) 50 MG tablet Take 50 mg by mouth daily.  ? [DISCONTINUED] alendronate (FOSAMAX) 70 MG tablet Take 70 mg by mouth once a week.  ? [DISCONTINUED] donepezil (ARICEPT) 5 MG tablet Take 5 mg by mouth daily.  ? [DISCONTINUED] ticagrelor (BRILINTA) 90 MG TABS tablet Take 1 tablet (90 mg total) by mouth 2 (two) times daily.  ?  ? ?Allergies:   Patient has no known allergies.  ? ?Social History  ? ?Socioeconomic History  ? Marital status: Married  ?  Spouse name: Not on file  ? Number of children: Not on file  ? Years of education: Not on file  ? Highest education level: Not on file  ?Occupational History  ? Not on file  ?Tobacco Use  ? Smoking status: Never  ? Smokeless tobacco: Never  ?Substance and Sexual Activity  ? Alcohol use: Yes  ?  Comment: 1/2 glass of wine on New Year's  ? Drug use: Never  ? Sexual activity: Never  ?Other Topics Concern  ? Not on file  ?Social History Narrative  ? Not on file  ? ?Social Determinants of Health  ? ?Financial Resource Strain: Not on file  ?Food Insecurity: Not on file  ?Transportation Needs: Not on file  ?Physical Activity: Not on file  ?Stress: Not on file  ?Social Connections: Not on file  ?  ? ?Family History: ?The patient's family history includes CAD in her father; Heart attack in her father; Stroke in her mother. ?ROS:   ?Please see the history of present illness.    ?All 14 point review of systems negative except as described per history of present illness ? ?EKGs/Labs/Other Studies Reviewed:   ? ? ? ?Recent Labs: ?03/09/2022: TSH 3.164 ?04/14/2022: ALT 20; BUN 18; Creatinine 0.7; Hemoglobin 13.9; Platelets 174; Potassium 4.0; Sodium 140  ?Recent Lipid Panel ?   ?Component Value Date/Time  ? CHOL 114 03/10/2022 0047  ? TRIG 90 03/10/2022 0047  ? HDL 34 (L) 03/10/2022 0047  ? CHOLHDL  3.4 03/10/2022 0047  ? VLDL 18 03/10/2022 0047  ? LDLCALC 62 03/10/2022 0047  ? ? ?Physical Exam:   ? ?VS:  BP 120/68 (BP Location: Left Arm, Patient Position: Sitting)   Pulse (!) 52   Ht 5\' 5"  (1.651 m)   Wt 135 lb 9.6 oz (61.5 kg)   SpO2 94%   BMI 22.57 kg/m?    ? ?Wt Readings from Last 3 Encounters:  ?04/17/22 135 lb 9.6 oz (61.5 kg)  ?04/14/22 133 lb 6.4 oz (60.5 kg)  ?03/28/22 132 lb (59.9 kg)  ?  ? ?GEN:  Well nourished, well developed in no acute distress ?HEENT: Normal ?NECK: No JVD; No carotid bruits ?LYMPHATICS: No lymphadenopathy ?CARDIAC: RRR, no murmurs, no rubs, no gallops ?RESPIRATORY:  Clear to auscultation without rales, wheezing or rhonchi  ?ABDOMEN: Soft, non-tender, non-distended ?MUSCULOSKELETAL:  No edema; No deformity  ?SKIN: Warm and dry ?LOWER EXTREMITIES: no swelling ?NEUROLOGIC:  Alert and oriented x 3 ?PSYCHIATRIC:  Normal affect  ? ?ASSESSMENT:   ? ?1. Coronary artery disease involving native coronary artery of native heart without angina pectoris   ?2. Essential hypertension   ?3. Acute ST elevation myocardial infarction (STEMI) of inferior wall (HCC)   ?4. Coronary artery disease of native artery of native heart with stable angina pectoris (HCC)   ?5. CHF NYHA class I, chronic, diastolic (HCC)   ? ?PLAN:   ? ?In order of problems listed above: ? ?Coronary disease stable on dual antiplatelet therapy recently her Brilinta has been changed to Plavix understand the reason for that is price of the medication.  She seems to be tolerating this medication well and denies having any symptomatology suggest reactivation of coronary artery disease ?Congestive heart failure.  Apparently admitted to the hospital recently with decompensated CHF.  Overall hemodynamically stable on the physical exam.  Her husband who is very good and following her care is checking her weight every single day and then he understand that he need to initiate Lasix if weight goes up.  I will check Chem-7 and proBNP  today. ?Essential hypertension blood pressure well controlled continue present management. ?Dyslipidemia she is on high intense statin which I will continue ? ?I did review record from [facility] in and out of hospit

## 2022-04-18 ENCOUNTER — Telehealth: Payer: Self-pay | Admitting: Hematology and Oncology

## 2022-04-18 LAB — BASIC METABOLIC PANEL
BUN/Creatinine Ratio: 16 (ref 12–28)
BUN: 16 mg/dL (ref 8–27)
CO2: 21 mmol/L (ref 20–29)
Calcium: 9.2 mg/dL (ref 8.7–10.3)
Chloride: 98 mmol/L (ref 96–106)
Creatinine, Ser: 0.97 mg/dL (ref 0.57–1.00)
Glucose: 113 mg/dL — ABNORMAL HIGH (ref 70–99)
Potassium: 4.9 mmol/L (ref 3.5–5.2)
Sodium: 136 mmol/L (ref 134–144)
eGFR: 58 mL/min/{1.73_m2} — ABNORMAL LOW (ref >59)

## 2022-04-18 LAB — PRO B NATRIURETIC PEPTIDE: NT-Pro BNP: 6471 pg/mL — ABNORMAL HIGH (ref 0–738)

## 2022-04-18 NOTE — Telephone Encounter (Signed)
04/18/22 spoke with spouse and scheduled appt on 04/20/22 ?

## 2022-04-19 ENCOUNTER — Other Ambulatory Visit: Payer: Self-pay | Admitting: Hematology and Oncology

## 2022-04-19 DIAGNOSIS — D696 Thrombocytopenia, unspecified: Secondary | ICD-10-CM

## 2022-04-19 LAB — PROTEIN ELECTROPHORESIS, SERUM
A/G Ratio: 1.4 (ref 0.7–1.7)
Albumin ELP: 3.5 g/dL (ref 2.9–4.4)
Alpha-1-Globulin: 0.3 g/dL (ref 0.0–0.4)
Alpha-2-Globulin: 0.5 g/dL (ref 0.4–1.0)
Beta Globulin: 0.7 g/dL (ref 0.7–1.3)
Gamma Globulin: 1 g/dL (ref 0.4–1.8)
Globulin, Total: 2.5 g/dL (ref 2.2–3.9)
Total Protein ELP: 6 g/dL (ref 6.0–8.5)

## 2022-04-20 ENCOUNTER — Inpatient Hospital Stay: Payer: Medicare Other

## 2022-04-20 ENCOUNTER — Encounter: Payer: Self-pay | Admitting: Hematology and Oncology

## 2022-04-20 ENCOUNTER — Inpatient Hospital Stay (INDEPENDENT_AMBULATORY_CARE_PROVIDER_SITE_OTHER): Payer: Medicare Other | Admitting: Hematology and Oncology

## 2022-04-20 DIAGNOSIS — D696 Thrombocytopenia, unspecified: Secondary | ICD-10-CM | POA: Diagnosis not present

## 2022-04-20 LAB — HEPATIC FUNCTION PANEL
ALT: 30 U/L (ref 7–35)
AST: 30 (ref 13–35)
Alkaline Phosphatase: 57 (ref 25–125)
Bilirubin, Total: 1.1

## 2022-04-20 LAB — CBC
MCV: 88 (ref 81–99)
RBC: 4.12 (ref 3.87–5.11)

## 2022-04-20 LAB — COMPREHENSIVE METABOLIC PANEL
Albumin: 4.1 (ref 3.5–5.0)
Calcium: 8.2 — AB (ref 8.7–10.7)

## 2022-04-20 LAB — BASIC METABOLIC PANEL
BUN: 20 (ref 4–21)
CO2: 27 — AB (ref 13–22)
Chloride: 98 — AB (ref 99–108)
Creatinine: 1 (ref 0.5–1.1)
Glucose: 106
Potassium: 3.9 mEq/L (ref 3.5–5.1)
Sodium: 134 — AB (ref 137–147)

## 2022-04-20 LAB — CBC AND DIFFERENTIAL
HCT: 36 (ref 36–46)
Hemoglobin: 11.5 — AB (ref 12.0–16.0)
Neutrophils Absolute: 6.71
Platelets: 69 10*3/uL — AB (ref 150–400)
WBC: 7.8

## 2022-04-20 LAB — FIBRINOGEN: Fibrinogen: 216 mg/dL (ref 210–475)

## 2022-04-20 NOTE — Progress Notes (Signed)
[facility]  [address] [place],  [place]  [address] [phone]  Clinic Day:  04/20/2022  Referring physician: Gordan Payment., MD  ASSESSMENT & PLAN:   Assessment & Plan: Thrombocytopenia (HCC) Slowly worsening thrombocytopenia over the past month felt to be most consistent with immune thrombocytopenic purpura.  She also has folate deficiency, which contribute to the thrombocytopenia, but is not likely the primary cause. She was given pulse Decadron 12 mg daily for 5 days and placed on folic acid daily.  She has had an increase in her platelets with pulse dexamethasone and they still may increase further.  She tolerated it fairly well, with increased agitation.  As her platelet count is above 50,000, no further intervention is recommended at this time.  We will continue to watch her closely to ensure there is no precipitous drop in her platelet count.   The patient understands the plans discussed today and is in agreement with them.  She knows to contact our office if she develops concerns prior to her next appointment.   I provided 20 minutes of face-to-face time during this encounter and > 50% was spent counseling as documented under my assessment and plan.    Maria Perl, PA-C  [facility] [address] [place] [address] Dept: [phone] Dept Fax: [phone]   Orders Placed This Encounter  Procedures   Basic metabolic panel    This external order was created through the Results Console.   Comprehensive metabolic panel    This external order was created through the Results Console.   Hepatic function panel    This external order was created through the Results Console.   CBC and differential    This external order was created through the Results Console.   CBC    This external order was created through the Results Console.   CBC    This order was created  through External Result Entry      CHIEF COMPLAINT:  CC: Thrombocytopenia, felt to be due to immune thrombocytopenic purpura  Current Treatment: Pulsed dexamethasone  HISTORY OF PRESENT ILLNESS:  Maria Holloway is a 86 y.o. female with dementia who has worsening thrombocytopenia over the past month.  She is referred by Dr. Feliciana Rossetti for assessment and management.  We began seeing her on May 12th, at which time her platelet count was 31,000.  She has a history of CVA in February of this year, for which she was on aspirin 81 mg daily.  On April 6th, she presented to [facility] ER after a fall with significant bruising of her face.  She had mild thrombocytopenia with a platelet count of 119,000.  White blood count and hemoglobin were normal.  CT head did not reveal any intracranial injury.  There was a forehead hematoma without calvarial fracture. Chronic small vessel disease with infarcts was seen.  EKG revealed ST elevation consistent with an acute myocardial infarction.  She was transferred to [facility] and underwent cardiac catheterization with stenting of the right coronary artery.  Her folate level was mildly low at 5.6.  She was discharged home on aspirin 81 mg daily and Brilinta 90 mg twice daily.  She was seen at the [facility] ER again on April 12th with general weakness and her platelet count remained low at 117,000.  Her hemoglobin was mildly low at 11.7 and bilirubin was 2.  She presented to the ER  again on April 15th with restlessness and anxiety, which she had been experiencing since her hospitalization for MRI.  She had persistent thrombocytopenia with a platelet count of 117,000, but normal white count and hemoglobin.  The bilirubin was 1.5.  No other specific concerns were found.  [facility] ER again on April 28th with general weakness and dizziness. Her CT head did not reveal any acute abnormality.  CTA chest did not reveal acute pulmonary embolism.   There were multiple pulmonary nodules the largest measuring 2.8 cm.  Repeat CT or PET scan was recommended.  Small bilateral pleural effusions with mild interstitial edema was seen.  She also had elevation of her BNP.  She was admitted and diuresed.  Echocardiogram revealed mild concentric left ventricular hypertrophy with overall normal left ventricular function with an ejection fraction of 55 to 60%.  She had persistent thrombocytopenia with a platelet count of 66,000 to 79,00 during her 3-day admission.  Her hemoglobin fluctuated up and down but remained above 11.  White blood count remained normal and bilirubin was normal.  She saw Dr. Shary DecampGrisso for follow-up on May 8th at which time her platelet count had dropped to 29,000, so she was referred here.  White blood cells, hemoglobin and bilirubin remained normal.  Dr. Allyson [facility]alBerry [facility] Plavix due to thrombocytopenia, which she was to start on May 13th.  Evaluation in our office revealed persistent mild folate deficiency with folic acid of 5.2.  B12 was normal.  There was no evidence of hemolysis.  Serum protein electrophoresis did not reveal a monoclonal protein.  We felt this was most consistent with immune thrombocytopenic purpura.  She was treated with pulse dexamethasone 12 mg daily for 5 days, which is a reduced dose, as we were concerned higher doses or treatment for an extended period of time would worsen her dementia and agitation.  She was also given haloperidol 1 mg every 8 hours to use as needed for agitation.  INTERVAL HISTORY:  Maria Holloway is here today for repeat clinical assessment after completing pulse dexamethasone for ITP.  Other than persistent agitation, she denies complaints.  Her husband accompanies her and states he felt  she tolerated the dexamethasone better than expected.  He has been using haloperidol as needed.  He states he feels her confusion is worse, and I attributed that to the steroids.  However, Dr. Shary DecampGrisso had  stopped her donepezil due to thrombocytopenia.  I instructed him to resume that.  He asks if he can give more haloperidol and potentially more often I advised him he could give her 2 mg as close together as every 6 hours if needed.  She denies fevers or chills. She denies pain. Her appetite is good. Her weight has been stable.  She is now on Plavix instead of Brilinta.  REVIEW OF SYSTEMS:  Review of Systems  Constitutional:  Negative for appetite change, chills, fatigue, fever and unexpected weight change.  HENT:   Negative for lump/mass, mouth sores and sore throat.   Respiratory:  Negative for cough and shortness of breath.   Cardiovascular:  Negative for chest pain and leg swelling.  Gastrointestinal:  Negative for abdominal pain, constipation, diarrhea, nausea and vomiting.  Genitourinary:  Negative for dysuria, frequency and hematuria.   Musculoskeletal:  Negative for arthralgias, back pain and myalgias.  Skin:  Negative for itching and rash.  Neurological:  Negative for dizziness and headaches.  Hematological:  Negative for adenopathy. Does not bruise/bleed easily.  Psychiatric/Behavioral:  Negative for depression and sleep disturbance. The patient is not nervous/anxious.     VITALS:  Blood pressure (!) 148/76, pulse 75, temperature (!) 97.5 F (36.4 C), temperature source Oral, resp. rate 18, height 5' 4.3" (1.633 m), weight 135 lb 14.4 oz (61.6 kg), SpO2 94 %.  Wt Readings from Last 3 Encounters:  04/20/22 135 lb 14.4 oz (61.6 kg)  04/17/22 135 lb 9.6 oz (61.5 kg)  04/14/22 133 lb 6.4 oz (60.5 kg)    Body mass index is 23.11 kg/m.  Performance status (ECOG): 0 - Asymptomatic  PHYSICAL EXAM:  Physical Exam Vitals and nursing note reviewed.  Constitutional:      General: She is not in acute distress.    Appearance: Normal appearance.  HENT:     Head: Normocephalic and atraumatic.     Mouth/Throat:     Mouth: Mucous membranes are moist.     Pharynx: Oropharynx is clear. No  oropharyngeal exudate or posterior oropharyngeal erythema.  Eyes:     General: No scleral icterus.    Extraocular Movements: Extraocular movements intact.     Conjunctiva/sclera: Conjunctivae normal.     Pupils: Pupils are equal, round, and reactive to light.  Cardiovascular:     Rate and Rhythm: Normal rate and regular rhythm.     Heart sounds: Normal heart sounds. No murmur heard.   No friction rub. No gallop.  Pulmonary:     Effort: Pulmonary effort is normal.     Breath sounds: Normal breath sounds. No wheezing, rhonchi or rales.  Abdominal:     General: There is no distension.     Palpations: Abdomen is soft. There is no hepatomegaly, splenomegaly or mass.     Tenderness: There is no abdominal tenderness.  Musculoskeletal:        General: Normal range of motion.     Cervical back: Normal range of motion and neck supple. No tenderness.     Right lower leg: No edema.     Left lower leg: No edema.  Lymphadenopathy:     Cervical: No cervical adenopathy.     Upper Body:     Right upper body: No supraclavicular or axillary adenopathy.     Left upper body: No supraclavicular or axillary adenopathy.     Lower Body: No right inguinal adenopathy. No left inguinal adenopathy.  Skin:    General: Skin is warm and dry.     Coloration: Skin is not jaundiced.     Findings: Ecchymosis (Scattered, multiple in various stages of healing) present. No bruising or rash.  Neurological:     Mental Status: She is alert and oriented to person, place, and time.     Cranial Nerves: No cranial nerve deficit.  Psychiatric:        Mood and Affect: Mood normal.        Behavior: Behavior normal.        Thought Content: Thought content normal.    LABS:      Latest Ref Rng & Units 04/20/2022   12:00 AM 04/14/2022   12:00 AM 03/10/2022   12:47 AM  CBC  WBC  7.8      5.3  C    6.5    Hemoglobin 12.0 - 16.0 11.5      11.4  C    11.3    Hematocrit 36 - 46 36      36  C    34.9    Platelets 150 - 400  K/uL 69      31  C    99      C Corrected result   This result is from an external source.      Latest Ref Rng & Units 04/20/2022   12:00 AM 04/17/2022   12:18 PM 04/14/2022   12:00 AM  CMP  Glucose 70 - 99 mg/dL  478     BUN 4 - C     Creatinine 0.5 - 1.1 1.0      0.97   0.8  C     Sodium 137 - 147 134      136   136  C     Potassium 3.5 - 5.1 mEq/L 3.9      4.9   3.6  C     Chloride 99 - 108 98      98   99  C     CO2 13 - C     Calcium 8.7 - 10.7 8.2      9.2   8.4  C     Alkaline Phos 25 - 125 57       63  C     AST 13 - 35 30       27       ALT 7 - 35 U/L 30       18  C       C Corrected result   This result is from an external source.     No results found for: CEA1 / No results found for: CEA1 No results found for: PSA1 No results found for: GNF621 No results found for: HYQ657  Lab Results  Component Value Date   TOTALPROTELP 6.0 04/14/2022   ALBUMINELP 3.5 04/14/2022   A1GS 0.3 04/14/2022   A2GS 0.5 04/14/2022   BETS 0.7 04/14/2022   GAMS 1.0 04/14/2022   MSPIKE Not Observed 04/14/2022   SPEI Comment 04/14/2022   No results found for: TIBC, FERRITIN, IRONPCTSAT Lab Results  Component Value Date   LDH 189 04/14/2022    STUDIES:  No results found.    HISTORY:   Past Medical History:  Diagnosis Date   Acute ST elevation myocardial infarction (STEMI) of inferior wall (HCC) 03/09/2022   Age-related osteoporosis without current pathological fracture 04/20/2021   Formatting of this note might be different from the original. Dexa 04/2021. At [facility].   Anxiety disorder 03/23/2021   Formatting of this note might be different from the original. Tried and failed escitalopram   At high risk for falls 01/23/2022   CAD (coronary artery disease)    a. s/p STEMI in 03/2022 with DES to prox/mid RCA.   Cerebral atherosclerosis    Delirium superimposed on dementia 03/10/2022   Essential hypertension    Fall 03/09/2022   GERD  without esophagitis 08/18/2021   History of arterial ischemic stroke 01/23/2022   Formatting of this note might be different from the original. 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/2021   Nocturia 09/26/2021   Osteoporosis    Primary osteoarthritis involving multiple joints 08/18/2021   Seborrheic keratosis 09/26/2021   Stroke (HCC)    Thrombocytopenia (HCC)    Vitamin B12 deficiency  Past Surgical History:  Procedure Laterality Date   CORONARY/GRAFT ACUTE MI REVASCULARIZATION N/A 03/09/2022   Procedure: Coronary/Graft Acute MI Revascularization;  Surgeon: Runell Gess, MD;  Location: [facility] INVASIVE CV LAB;  Service: Cardiovascular;  Laterality: N/A;   LEFT HEART CATH AND CORONARY ANGIOGRAPHY N/A 03/09/2022   Procedure: LEFT HEART CATH AND CORONARY ANGIOGRAPHY;  Surgeon: Runell Gess, MD;  Location: [facility] INVASIVE CV LAB;  Service: Cardiovascular;  Laterality: N/A;    Family History  Problem Relation Age of Onset   Stroke Mother    CAD Father    Heart attack Father     Social History:  reports that she has never smoked. She has never used smokeless tobacco. She reports current alcohol use. She reports that she does not use drugs.The patient is accompanied by her husband today.  Allergies: No Known Allergies  Current Medications: Current Outpatient Medications  Medication Sig Dispense Refill   clopidogrel (PLAVIX) 75 MG tablet Take 75 mg by mouth daily.     Folic Acid (FOLATE PO)      aspirin EC 81 MG tablet Take 81 mg by mouth daily. Swallow whole.     atorvastatin (LIPITOR) 80 MG tablet Take 80 mg by mouth at bedtime.     cyanocobalamin 1000 MCG tablet Take 1,000 mcg by mouth daily.     furosemide (LASIX) 20 MG tablet Take 20 mg by mouth as needed for fluid or edema.     haloperidol (HALDOL) 1 MG tablet Take 1 tablet (1 mg total) by mouth every 8 (eight)  hours as needed for agitation. 60 tablet 1   losartan (COZAAR) 50 MG tablet Take 25 mg by mouth daily.     metoprolol tartrate (LOPRESSOR) 25 MG tablet Take 1 tablet (25 mg total) by mouth 2 (two) times daily. 180 tablet 1   nitroGLYCERIN (NITROSTAT) 0.4 MG SL tablet Place 1 tablet (0.4 mg total) under the tongue every 5 (five) minutes x 3 doses as needed for chest pain. 25 tablet 3   pantoprazole (PROTONIX) 40 MG tablet Take 1 tablet (40 mg total) by mouth daily. 90 tablet 1   sertraline (ZOLOFT) 50 MG tablet Take 50 mg by mouth daily.     No current facility-administered medications for this visit.

## 2022-04-20 NOTE — Assessment & Plan Note (Addendum)
Slowly worsening thrombocytopenia over the past month felt to be most consistent with immune thrombocytopenic purpura.  She also has folate deficiency, which contribute to the thrombocytopenia, but is not likely the primary cause. She was given pulse Decadron 12 mg daily for 5 days and placed on folic acid daily.  She has had an increase in her platelets with pulse dexamethasone and they still may increase further.  She tolerated it fairly well, with increased agitation.  As her platelet count is above 50,000, no further intervention is recommended at this time.  We will continue to watch her closely to ensure there is no precipitous drop in her platelet count.

## 2022-04-21 ENCOUNTER — Ambulatory Visit: Payer: Medicare Other | Admitting: Nurse Practitioner

## 2022-04-26 ENCOUNTER — Other Ambulatory Visit: Payer: Self-pay | Admitting: Hematology and Oncology

## 2022-04-26 DIAGNOSIS — D693 Immune thrombocytopenic purpura: Secondary | ICD-10-CM

## 2022-04-27 ENCOUNTER — Encounter: Payer: Self-pay | Admitting: Hematology and Oncology

## 2022-04-27 ENCOUNTER — Inpatient Hospital Stay: Payer: Medicare Other | Admitting: Hematology and Oncology

## 2022-04-27 ENCOUNTER — Inpatient Hospital Stay: Payer: Medicare Other

## 2022-04-27 DIAGNOSIS — E538 Deficiency of other specified B group vitamins: Secondary | ICD-10-CM

## 2022-04-27 DIAGNOSIS — D693 Immune thrombocytopenic purpura: Secondary | ICD-10-CM

## 2022-04-27 HISTORY — DX: Deficiency of other specified B group vitamins: E53.8

## 2022-04-27 LAB — BASIC METABOLIC PANEL
BUN: 9 (ref 4–21)
CO2: 27 — AB (ref 13–22)
Chloride: 101 (ref 99–108)
Creatinine: 0.7 (ref 0.5–1.1)
Glucose: 126
Potassium: 3.7 mEq/L (ref 3.5–5.1)
Sodium: 135 — AB (ref 137–147)

## 2022-04-27 LAB — HEPATIC FUNCTION PANEL
ALT: 24 U/L (ref 7–35)
AST: 28 (ref 13–35)
Alkaline Phosphatase: 68 (ref 25–125)
Bilirubin, Total: 0.9

## 2022-04-27 LAB — CBC AND DIFFERENTIAL
HCT: 36 (ref 36–46)
Hemoglobin: 11.5 — AB (ref 12.0–16.0)
Neutrophils Absolute: 3.45
Platelets: 39 10*3/uL — AB (ref 150–400)
WBC: 5

## 2022-04-27 LAB — PROTEIN, TOTAL: Total Protein: 5.8 g/dL — AB (ref 6.3–8.2)

## 2022-04-27 LAB — CORRECTED CALCIUM (CC13): Calcium, Corrected: 8.8

## 2022-04-27 LAB — COMPREHENSIVE METABOLIC PANEL
Albumin: 3.3 — AB (ref 3.5–5.0)
Calcium: 8.1 — AB (ref 8.7–10.7)

## 2022-04-27 LAB — CBC: RBC: 4.12 (ref 3.87–5.11)

## 2022-04-27 NOTE — Assessment & Plan Note (Addendum)
Slowly worsening thrombocytopenia felt to be most consistent with immune thrombocytopenic purpura.  She also has folate deficiency, which contribute to the thrombocytopenia, but is not likely the primary cause. She was given pulse Decadron 12 mg daily for 5 days and placed on folic acid daily.  She had an increase in her platelets with pulse dexamethasone up to 69,000.  Unfortunately, the platelets are already drifting down to 39,000. He husband would like to avoid steroids if possible.  We will therefore continue to watch her closely to ensure there is no precipitous drop in her platelet count.  Her husband understands the risk of increased bleeding with injury.

## 2022-04-27 NOTE — Progress Notes (Signed)
[facility]  [address] [place],  [place]  [address] [phone]  Clinic Day:  04/27/2022  Referring physician: Gordan Payment., MD  ASSESSMENT & PLAN:   Assessment & Plan: Immune thrombocytopenic purpura (HCC) Slowly worsening thrombocytopenia felt to be most consistent with immune thrombocytopenic purpura.  She also has folate deficiency, which contribute to the thrombocytopenia, but is not likely the primary cause. She was given pulse Decadron 12 mg daily for 5 days and placed on folic acid daily.  She had an increase in her platelets with pulse dexamethasone up to 69,000.  Unfortunately, the platelets are already drifting down to 39,000. He husband would like to avoid steroids if possible.  We will therefore continue to watch her closely to ensure there is no precipitous drop in her platelet count.  Her husband understands the risk of increased bleeding with injury.  Folate deficiency She is taking folic acid daily.    The patient was staffed and plan formulated with Dr. Gilman Buttner.  We will plan to see her back in 1 week for continued close observation.  Her husband understands the plans discussed today and is in agreement with them.  She knows to contact our office if she develops concerns prior to her next appointment.   I provided 20 minutes of face-to-face time during this encounter and > 50% was spent counseling as documented under my assessment and plan.    Adah Perl, PA-C  [facility] [address] [place] [address] Dept: [phone] Dept Fax: [phone]   Orders Placed This Encounter  Procedures   Basic metabolic panel    This external order was created through the Results Console.   Comprehensive metabolic panel    This external order was created through the Results Console.   Hepatic function panel    This external order was created through the Results  Console.   Corrected Calcium    This order was created through External Result Entry   Protein, total    This order was created through External Result Entry   CBC and differential    This external order was created through the Results Console.   CBC    This external order was created through the Results Console.      CHIEF COMPLAINT:  CC: ITP  Current Treatment: Status post pulse dexamethasone  HISTORY OF PRESENT ILLNESS:  Maria Holloway is a 86 y.o. female with dementia who has worsening thrombocytopenia over the past month.  She is referred by Dr. Feliciana Rossetti for assessment and management.  We began seeing her on May 12th, at which time her platelet count was 31,000.  She has a history of CVA in February of this year, for which she was on aspirin 81 mg daily.  On April 6th, she presented to [facility] ER after a fall with significant bruising of her face.  She had mild thrombocytopenia with a platelet count of 119,000.  White blood count and hemoglobin were normal.  CT head did not reveal any intracranial injury.  There was a forehead hematoma without calvarial fracture. Chronic small vessel disease with infarcts was seen.  EKG revealed ST elevation consistent with an acute myocardial infarction.  She was transferred to [facility] and underwent cardiac catheterization with stenting of the right coronary artery.  Her folate level was mildly low at 5.6.  She was discharged home on aspirin 81 mg daily and Brilinta  90 mg twice daily.  She was seen at the [facility] ER again on April 12th with general weakness and her platelet count remained low at 117,000.  Her hemoglobin was mildly low at 11.7 and bilirubin was 2.  She presented to the ER again on April 15th with restlessness and anxiety, which she had been experiencing since her hospitalization for MRI.  She had persistent thrombocytopenia with a platelet count of 117,000, but normal white count and hemoglobin.  The bilirubin was 1.5.   No other specific concerns were found.  She presented to the [facility] ER again on April 28th with general weakness and dizziness. Her CT head did not reveal any acute abnormality.  CTA chest did not reveal acute pulmonary embolism.  There were multiple pulmonary nodules the largest measuring 2.8 cm.  Repeat CT or PET scan was recommended.  Small bilateral pleural effusions with mild interstitial edema was seen.  She also had elevation of her BNP.  She was admitted and diuresed.  Echocardiogram revealed mild concentric left ventricular hypertrophy with overall normal left ventricular function with an ejection fraction of 55 to 60%.  She had persistent thrombocytopenia with a platelet count of 66,000 to 79,00 during her 3-day admission.  Her hemoglobin fluctuated up and down but remained above 11.  White blood count remained normal and bilirubin was normal.  She saw Dr. Shary DecampGrisso for follow-up on May 8th at which time her platelet count had dropped to 29,000, so she was referred here.  White blood cells, hemoglobin and bilirubin remained normal.  Dr. Allyson SabalBerry [facility] Plavix due to thrombocytopenia, which she was to start on May 13th.   Evaluation in our office revealed persistent mild folate deficiency with folic acid of 5.2.  B12 was normal.  There was no evidence of hemolysis.  Serum protein electrophoresis did not reveal a monoclonal protein.  We felt this was most consistent with immune thrombocytopenic purpura.  She was treated with pulse dexamethasone 12 mg daily for 5 days, which is a reduced dose, as we were concerned higher doses or treatment for an extended period of time would worsen her dementia and agitation.  She was also given haloperidol 1 mg every 8 hours to use as needed for agitation.  INTERVAL HISTORY:  Maria Holloway is here today for repeat clinical assessment and denies any new complaints.  She had a fall in the grass despite using her cane due to weakness.  She and her husband  deny any injury.  She has a walker, but she does not like to use it.  Her physical therapist felt she was generally weaker 2 days ago, so her husband to take her to the emergency room main 23rd.  CBC revealed that her platelet count had dropped to 41,000.  She denies fevers or chills. She denies pain. Her appetite is good. Her weight has decreased 3 pounds over last week .  She states she is eating well.  She reports anxiety and depression, for which she is on sertraline.  Even with a cane, I told her she should have assistance walking.  I asked her to try using her with a physical therapist.  REVIEW OF SYSTEMS:  Review of Systems  Constitutional:  Negative for appetite change, chills, fatigue, fever and unexpected weight change.  HENT:   Negative for lump/mass, mouth sores and sore throat.   Respiratory:  Negative for cough and shortness of breath.   Cardiovascular:  Negative for chest pain and leg  swelling.  Gastrointestinal:  Negative for abdominal pain, constipation, diarrhea, nausea and vomiting.  Endocrine: Negative for hot flashes.  Genitourinary:  Negative for difficulty urinating, dysuria, frequency and hematuria.   Musculoskeletal:  Negative for arthralgias, back pain and myalgias.  Skin:  Negative for rash.  Neurological:  Negative for dizziness and headaches.  Hematological:  Negative for adenopathy. Does not bruise/bleed easily.  Psychiatric/Behavioral:  Positive for depression. Negative for sleep disturbance. The patient is nervous/anxious.     VITALS:  Blood pressure (!) 160/68, pulse 63, temperature (!) 97.5 F (36.4 C), temperature source Oral, resp. rate 20, height 5' 4.3" (1.633 m), weight 132 lb 4.8 oz (60 kg), SpO2 91 %.  Wt Readings from Last 3 Encounters:  04/27/22 132 lb 4.8 oz (60 kg)  04/20/22 135 lb 14.4 oz (61.6 kg)  04/17/22 135 lb 9.6 oz (61.5 kg)    Body mass index is 22.5 kg/m.  Performance status (ECOG): 2 - Symptomatic, <50% confined to bed  PHYSICAL  EXAM:  Physical Exam Vitals and nursing note reviewed.  Constitutional:      General: She is not in acute distress.    Appearance: Normal appearance.  HENT:     Head: Normocephalic and atraumatic.     Mouth/Throat:     Mouth: Mucous membranes are moist.     Pharynx: Oropharynx is clear. No oropharyngeal exudate or posterior oropharyngeal erythema.  Eyes:     General: No scleral icterus.    Extraocular Movements: Extraocular movements intact.     Conjunctiva/sclera: Conjunctivae normal.     Pupils: Pupils are equal, round, and reactive to light.  Cardiovascular:     Rate and Rhythm: Normal rate and regular rhythm.     Heart sounds: Normal heart sounds. No murmur heard.   No friction rub. No gallop.  Pulmonary:     Effort: Pulmonary effort is normal.     Breath sounds: Normal breath sounds. No wheezing, rhonchi or rales.  Abdominal:     General: There is no distension.     Palpations: Abdomen is soft. There is no hepatomegaly, splenomegaly or mass.     Tenderness: There is no abdominal tenderness.  Musculoskeletal:        General: Normal range of motion.     Cervical back: Normal range of motion and neck supple. No tenderness.     Right lower leg: No edema.     Left lower leg: No edema.  Lymphadenopathy:     Cervical: No cervical adenopathy.     Upper Body:     Right upper body: No supraclavicular or axillary adenopathy.     Left upper body: No supraclavicular or axillary adenopathy.     Lower Body: No right inguinal adenopathy. No left inguinal adenopathy.  Skin:    General: Skin is warm and dry.     Coloration: Skin is not jaundiced.     Findings: Bruising (Resolving) present. No rash.  Neurological:     Mental Status: She is alert and oriented to person, place, and time.     Cranial Nerves: No cranial nerve deficit.  Psychiatric:        Mood and Affect: Mood normal.        Behavior: Behavior normal.        Thought Content: Thought content normal.    LABS:       Latest Ref Rng & Units 04/27/2022   12:00 AM 04/20/2022   12:00 AM 04/14/2022   12:00 AM  CBC  WBC  5.0      7.8      5.3  C     Hemoglobin 12.0 - 16.0 11.5      11.5      11.4  C     Hematocrit 36 - 46 36      36      36  C     Platelets 150 - 400 K/uL 39      69      31  C       C Corrected result   This result is from an external source.      Latest Ref Rng & Units 04/27/2022   12:00 AM 04/20/2022   12:00 AM 04/17/2022   12:18 PM  CMP  Glucose 70 - 99 mg/dL   161    BUN 4 - Creatinine 0.5 - 1.1 0.7      1.0      0.97    Sodium 137 - 147 135      134      136    Potassium 3.5 - 5.1 mEq/L 3.7      3.9      4.9    Chloride 99 - 108 101      98      98    CO2 13 - Calcium 8.7 - 10.7 8.1      8.2      9.2    Total Protein 6.3 - 8.2 g/dL 5.8         Alkaline Phos 25 - 125 68      57        AST 13 - 35 28      30        ALT 7 - 35 U/L 24      30           This result is from an external source.     No results found for: CEA1 / No results found for: CEA1 No results found for: PSA1 No results found for: WRU045 No results found for: WUJ811  Lab Results  Component Value Date   TOTALPROTELP 6.0 04/14/2022   ALBUMINELP 3.5 04/14/2022   A1GS 0.3 04/14/2022   A2GS 0.5 04/14/2022   BETS 0.7 04/14/2022   GAMS 1.0 04/14/2022   MSPIKE Not Observed 04/14/2022   SPEI Comment 04/14/2022   No results found for: TIBC, FERRITIN, IRONPCTSAT Lab Results  Component Value Date   LDH 189 04/14/2022    STUDIES:  No results found.    HISTORY:   Past Medical History:  Diagnosis Date   Acute ST elevation myocardial infarction (STEMI) of inferior wall (HCC) 03/09/2022   Age-related osteoporosis without current pathological fracture 04/20/2021   Formatting of this note might be different from the original. Dexa 04/2021. [facility]   Anxiety disorder 03/23/2021   Formatting of this note might be different from the original. Tried and failed  escitalopram   At high risk for falls 01/23/2022   CAD (coronary artery disease)    a. s/p STEMI in 03/2022 with DES to prox/mid RCA.   Cerebral atherosclerosis    Delirium superimposed on dementia 03/10/2022   Essential hypertension    Fall 03/09/2022   Folate deficiency 04/27/2022   GERD  without esophagitis 08/18/2021   History of arterial ischemic stroke 01/23/2022   Formatting of this note might be different from the original. 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/2021   Nocturia 09/26/2021   Osteoporosis    Primary osteoarthritis involving multiple joints 08/18/2021   Seborrheic keratosis 09/26/2021   Stroke (HCC)    Thrombocytopenia (HCC)    Vitamin B12 deficiency     Past Surgical History:  Procedure Laterality Date   CORONARY/GRAFT ACUTE MI REVASCULARIZATION N/A 03/09/2022   Procedure: Coronary/Graft Acute MI Revascularization;  Surgeon: Runell Gess, MD;  Location: [facility] INVASIVE CV LAB;  Service: Cardiovascular;  Laterality: N/A;   LEFT HEART CATH AND CORONARY ANGIOGRAPHY N/A 03/09/2022   Procedure: LEFT HEART CATH AND CORONARY ANGIOGRAPHY;  Surgeon: Runell Gess, MD;  Location: [facility] INVASIVE CV LAB;  Service: Cardiovascular;  Laterality: N/A;    Family History  Problem Relation Age of Onset   Stroke Mother    CAD Father    Heart attack Father     Social History:  reports that she has never smoked. She has never used smokeless tobacco. She reports current alcohol use. She reports that she does not use drugs.The patient is accompanied by her husband today.  Allergies: No Known Allergies  Current Medications: Current Outpatient Medications  Medication Sig Dispense Refill   aspirin EC 81 MG tablet Take 81 mg by mouth daily. Swallow whole.     atorvastatin (LIPITOR) 80 MG tablet Take 80 mg by mouth at bedtime.     clopidogrel (PLAVIX) 75 MG tablet  Take 75 mg by mouth daily.     cyanocobalamin 1000 MCG tablet Take 1,000 mcg by mouth daily.     Folic Acid (FOLATE PO)      furosemide (LASIX) 20 MG tablet Take 20 mg by mouth as needed for fluid or edema.     haloperidol (HALDOL) 1 MG tablet Take 1 tablet (1 mg total) by mouth every 8 (eight) hours as needed for agitation. 60 tablet 1   losartan (COZAAR) 50 MG tablet Take 25 mg by mouth daily.     metoprolol tartrate (LOPRESSOR) 25 MG tablet Take 1 tablet (25 mg total) by mouth 2 (two) times daily. 180 tablet 1   nitroGLYCERIN (NITROSTAT) 0.4 MG SL tablet Place 1 tablet (0.4 mg total) under the tongue every 5 (five) minutes x 3 doses as needed for chest pain. 25 tablet 3   pantoprazole (PROTONIX) 40 MG tablet Take 1 tablet (40 mg total) by mouth daily. 90 tablet 1   sertraline (ZOLOFT) 50 MG tablet Take 50 mg by mouth daily.     No current facility-administered medications for this visit.

## 2022-04-27 NOTE — Assessment & Plan Note (Signed)
She is taking folic acid daily.

## 2022-05-03 ENCOUNTER — Ambulatory Visit: Payer: Medicare Other | Admitting: Cardiology

## 2022-05-03 NOTE — Assessment & Plan Note (Signed)
Slowly worsening thrombocytopenia felt to be most consistent with immune thrombocytopenic purpura.  She also has folate deficiency, which contribute to the thrombocytopenia, but is not likely the primary cause. She was given pulse Decadron 12 mg daily for 5 days and placed on folic acid daily.  She had an increase in her platelets with pulse dexamethasone up to 69,000.  Unfortunately, the platelets decreased 39,000 the following week. Her husband has wanted to avoid further steroids if possible.  As her platelets were above 20,000, continued observation.

## 2022-05-03 NOTE — Assessment & Plan Note (Signed)
She continues folic acid daily.

## 2022-05-03 NOTE — Progress Notes (Signed)
[facility]  [address] [place],  [place]  [address] [phone]  Clinic Day:  05/04/2022  Referring physician: Gordan Payment., MD  ASSESSMENT & PLAN:   Assessment & Plan: Immune thrombocytopenic purpura (HCC) Slowly worsening thrombocytopenia felt to be most consistent with immune thrombocytopenic purpura.  She also has folate deficiency, which contribute to the thrombocytopenia, but is not likely the primary cause. She was given pulse Decadron 12 mg daily for 5 days and placed on folic acid daily.  She had an increase in her platelets with pulse dexamethasone up to 69,000.  Unfortunately, the platelets decreased 39,000 the following week.  The platelets are fairly stable today at 37,000 and she has not had any abnormal bruising or bleeding.Marland Kitchen Her husband has wanted to avoid further steroids if possible.  As her platelets were above 20,000, we will continue observation.  I will plan to see her back in 1 week for repeat clinical assessment.  Folate deficiency She continues folic acid daily.   The patient's husband understands the plans discussed today and is in agreement with them.  He knows to contact our office if she develops concerns prior to her next appointment.   I provided 15 minutes of face-to-face time during this encounter and > 50% was spent counseling as documented under my assessment and plan.    Adah Perl, PA-C  [facility] [address] [place] [address] Dept: [phone] Dept Fax: [phone]   Orders Placed This Encounter  Procedures   CBC and differential    This external order was created through the Results Console.   CBC    This external order was created through the Results Console.   Basic metabolic panel    This external order was created through the Results Console.   Comprehensive metabolic panel    This external order was created through  the Results Console.   Hepatic function panel    This external order was created through the Results Console.   CBC    This order was created through External Result Entry   CBC    This order was created through External Result Entry      CHIEF COMPLAINT:  CC: ITP  Current Treatment:  Status post pulse Decadron observation  HISTORY OF PRESENT ILLNESS:  Maria Holloway is a 86 y.o. female with dementia who has worsening thrombocytopenia over the past month.  She is referred by Dr. Feliciana Rossetti for assessment and management.  We began seeing her on May 12th, at which time her platelet count was 31,000.  She has a history of CVA in February of this year, for which she was on aspirin 81 mg daily.  On April 6th, she presented to [facility] ER after a fall with significant bruising of her face.  She had mild thrombocytopenia with a platelet count of 119,000.  White blood count and hemoglobin were normal.  CT head did not reveal any intracranial injury.  There was a forehead hematoma without calvarial fracture. Chronic small vessel disease with infarcts was seen.  EKG revealed ST elevation consistent with an acute myocardial infarction.  She was transferred to [facility] and underwent cardiac catheterization with stenting of the right coronary artery.  Her folate level was mildly low at 5.6.  She was discharged home on aspirin 81 mg daily and Brilinta 90 mg twice daily.  She was seen at the [facility] ER  again on April 12th with general weakness and her platelet count remained low at 117,000.  Her hemoglobin was mildly low at 11.7 and bilirubin was 2.  She presented to the ER again on April 15th with restlessness and anxiety, which she had been experiencing since her hospitalization for MRI.  She had persistent thrombocytopenia with a platelet count of 117,000, but normal white count and hemoglobin.  The bilirubin was 1.5.  No other specific concerns were found.  She presented to the [facility] ER again on April 28th with general weakness and dizziness. Her CT head did not reveal any acute abnormality.  CTA chest did not reveal acute pulmonary embolism.  There were multiple pulmonary nodules the largest measuring 2.8 cm.  Repeat CT or PET scan was recommended.  Small bilateral pleural effusions with mild interstitial edema was seen.  She also had elevation of her BNP.  She was admitted and diuresed.  Echocardiogram revealed mild concentric left ventricular hypertrophy with overall normal left ventricular function with an ejection fraction of 55 to 60%.  She had persistent thrombocytopenia with a platelet count of 66,000 to 79,00 during her 3-day admission.  Her hemoglobin fluctuated up and down but remained above 11.  White blood count remained normal and bilirubin was normal.  She saw Dr. Shary Decamp for follow-up on May 8th at which time her platelet count had dropped to 29,000, so she was referred here.  White blood cells, hemoglobin and bilirubin remained normal.  Dr. Allyson Sabal switched from [facility] to Plavix due to thrombocytopenia, which she was to start on May 13th.   Evaluation in our office revealed persistent mild folate deficiency with folic acid of 5.2.  B12 was normal.  There was no evidence of hemolysis.  Serum protein electrophoresis did not reveal a monoclonal protein.  We felt this was most consistent with immune thrombocytopenic purpura.  She was treated with pulse dexamethasone 12 mg daily for 5 days, which is a reduced dose, as we were concerned higher doses or treatment for an extended period of time would worsen her dementia and agitation.  She was also given haloperidol 1 mg every 8 hours to use as needed for agitation.  She tolerated dexamethasone better than expected, but did require Haldol fairly regularly.  Initially, her platelet count went to 69,000, but the following week had decreased to 39,000.  As her platelet count was about 20,000, we opted for observation  INTERVAL  HISTORY:  Maria Holloway is here today for repeat clinical assessment and has been doing fairly well.  She remains agitated and has had occasional falls.  She fell at the restaurant right before coming in today.  She denies injury.  Her husband accompanies her and states she did not hit hard and injure herself.  She denies abnormal bruising or bleeding.  She denies fevers or chills. She denies pain. Her appetite is good. Her weight has decreased 2 pounds over last week .  REVIEW OF SYSTEMS:  Review of Systems  Constitutional:  Negative for appetite change, chills, fatigue, fever and unexpected weight change.  HENT:   Negative for lump/mass, mouth sores and sore throat.   Respiratory:  Negative for cough and shortness of breath.   Cardiovascular:  Negative for chest pain and leg swelling.  Gastrointestinal:  Negative for abdominal pain, constipation, diarrhea, nausea and vomiting.  Endocrine: Negative for hot flashes.  Genitourinary:  Negative for difficulty urinating, dysuria, frequency and hematuria.   Musculoskeletal:  Positive for gait problem (Walks with  cane). Negative for arthralgias, back pain and myalgias.  Skin:  Negative for rash.  Neurological:  Positive for gait problem (Walks with cane). Negative for dizziness and headaches.  Hematological:  Negative for adenopathy. Does not bruise/bleed easily.  Psychiatric/Behavioral:  Negative for depression and sleep disturbance. The patient is not nervous/anxious.     VITALS:  Blood pressure (!) 134/93, pulse 83, temperature 98.4 F (36.9 C), temperature source Oral, resp. rate 18, height 5' 4.3" (1.633 m), weight 130 lb 3.2 oz (59.1 kg), SpO2 96 %.  Wt Readings from Last 3 Encounters:  05/04/22 130 lb 3.2 oz (59.1 kg)  04/27/22 132 lb 4.8 oz (60 kg)  04/20/22 135 lb 14.4 oz (61.6 kg)    Body mass index is 22.14 kg/m.  Performance status (ECOG): 2 - Symptomatic, <50% confined to bed  PHYSICAL EXAM:  Physical Exam Vitals and nursing note  reviewed.  Constitutional:      General: She is not in acute distress.    Appearance: Normal appearance.  HENT:     Head: Normocephalic and atraumatic.     Mouth/Throat:     Mouth: Mucous membranes are moist.     Pharynx: Oropharynx is clear. No oropharyngeal exudate or posterior oropharyngeal erythema.  Eyes:     General: No scleral icterus.    Extraocular Movements: Extraocular movements intact.     Conjunctiva/sclera: Conjunctivae normal.     Pupils: Pupils are equal, round, and reactive to light.  Cardiovascular:     Rate and Rhythm: Normal rate and regular rhythm.     Heart sounds: No murmur heard.   No friction rub. No gallop.  Pulmonary:     Effort: Pulmonary effort is normal.     Breath sounds: Normal breath sounds. No wheezing, rhonchi or rales.  Abdominal:     General: There is no distension.     Palpations: Abdomen is soft. There is no hepatomegaly, splenomegaly or mass.     Tenderness: There is no abdominal tenderness.  Musculoskeletal:        General: Normal range of motion.     Cervical back: Normal range of motion and neck supple. No tenderness.     Right lower leg: No edema.     Left lower leg: No edema.  Lymphadenopathy:     Cervical: No cervical adenopathy.     Upper Body:     Right upper body: No supraclavicular or axillary adenopathy.     Left upper body: No supraclavicular or axillary adenopathy.     Lower Body: No right inguinal adenopathy. No left inguinal adenopathy.  Skin:    General: Skin is warm and dry.     Coloration: Skin is not jaundiced.     Findings: No rash.  Neurological:     Mental Status: She is alert and oriented to person, place, and time.     Cranial Nerves: No cranial nerve deficit.  Psychiatric:        Mood and Affect: Mood normal.        Behavior: Behavior normal.        Thought Content: Thought content normal.    LABS:      Latest Ref Rng & Units 05/04/2022   12:00 AM 04/27/2022   12:00 AM 04/20/2022   12:00 AM  CBC  WBC   4.6      5.0      7.8       Hemoglobin 12.0 - 16.0 11.2      11.5  11.5       Hematocrit 36 - 46 35      36      36       Platelets 150 - 400 K/uL 37      39      69          This result is from an external source.      Latest Ref Rng & Units 05/04/2022   12:00 AM 04/27/2022   12:00 AM 04/20/2022   12:00 AM  CMP  BUN 4 - 21 9      9      20        Creatinine 0.5 - 1.1 0.7      0.7      1.0       Sodium 137 - 147 134      135      134       Potassium 3.5 - 5.1 mEq/L 3.8      3.7      3.9       Chloride 99 - 108 99      101      98       CO2 13 - 22 29      27      27        Calcium 8.7 - 10.7 8.4      8.1      8.2       Total Protein 6.3 - 8.2 g/dL  5.8        Alkaline Phos 25 - 125 76      68      57       AST 13 - 35 31      28      30        ALT 7 - 35 U/L 23      24      30           This result is from an external source.     No results found for: CEA1 / No results found for: CEA1 No results found for: PSA1 No results found for: [id number] No results found for: [id number]  Lab Results  Component Value Date   TOTALPROTELP 6.0 04/14/2022   ALBUMINELP 3.5 04/14/2022   A1GS 0.3 04/14/2022   A2GS 0.5 04/14/2022   BETS 0.7 04/14/2022   GAMS 1.0 04/14/2022   MSPIKE Not Observed 04/14/2022   SPEI Comment 04/14/2022   No results found for: TIBC, FERRITIN, IRONPCTSAT Lab Results  Component Value Date   LDH 189 04/14/2022    STUDIES:  No results found.    HISTORY:   Past Medical History:  Diagnosis Date   Acute ST elevation myocardial infarction (STEMI) of inferior wall (HCC) 03/09/2022   Age-related osteoporosis without current pathological fracture 04/20/2021   Formatting of this note might be different from the original. Dexa 04/2021. [facility].   Anxiety disorder 03/23/2021   Formatting of this note might be different from the original. Tried and failed escitalopram   At high risk for falls 01/23/2022   CAD (coronary artery disease)    a. s/p STEMI in 03/2022 with DES  to prox/mid RCA.   Cerebral atherosclerosis    Delirium superimposed on dementia 03/10/2022   Essential hypertension    Fall 03/09/2022   Folate deficiency 04/27/2022   GERD without esophagitis 08/18/2021   History of arterial ischemic stroke 01/23/2022   Formatting of this  note might be different from the original. 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/2021   Nocturia 09/26/2021   Osteoporosis    Primary osteoarthritis involving multiple joints 08/18/2021   Seborrheic keratosis 09/26/2021   Stroke (HCC)    Thrombocytopenia (HCC)    Vitamin B12 deficiency     Past Surgical History:  Procedure Laterality Date   CORONARY/GRAFT ACUTE MI REVASCULARIZATION N/A 03/09/2022   Procedure: Coronary/Graft Acute MI Revascularization;  Surgeon: Runell Gess, MD;  Location: [facility] INVASIVE CV LAB;  Service: Cardiovascular;  Laterality: N/A;   LEFT HEART CATH AND CORONARY ANGIOGRAPHY N/A 03/09/2022   Procedure: LEFT HEART CATH AND CORONARY ANGIOGRAPHY;  Surgeon: Runell Gess, MD;  Location: [facility] INVASIVE CV LAB;  Service: Cardiovascular;  Laterality: N/A;    Family History  Problem Relation Age of Onset   Stroke Mother    CAD Father    Heart attack Father     Social History:  reports that she has never smoked. She has never used smokeless tobacco. She reports current alcohol use. She reports that she does not use drugs.The patient is accompanied by her husband today.  Allergies: No Known Allergies  Current Medications: Current Outpatient Medications  Medication Sig Dispense Refill   aspirin EC 81 MG tablet Take 81 mg by mouth daily. Swallow whole.     atorvastatin (LIPITOR) 80 MG tablet Take 80 mg by mouth at bedtime.     carvedilol (COREG) 6.25 MG tablet Take 6.25 mg by mouth 2 (two) times daily.     clopidogrel (PLAVIX) 75 MG tablet Take 75 mg by mouth daily.      cyanocobalamin 1000 MCG tablet Take 1,000 mcg by mouth daily.     Folic Acid (FOLATE PO)      furosemide (LASIX) 20 MG tablet Take 20 mg by mouth as needed for fluid or edema.     haloperidol (HALDOL) 1 MG tablet Take 1 tablet (1 mg total) by mouth every 8 (eight) hours as needed for agitation. 60 tablet 1   losartan (COZAAR) 50 MG tablet Take 25 mg by mouth daily.     metoprolol tartrate (LOPRESSOR) 25 MG tablet Take 1 tablet (25 mg total) by mouth 2 (two) times daily. 180 tablet 1   nitroGLYCERIN (NITROSTAT) 0.4 MG SL tablet Place 1 tablet (0.4 mg total) under the tongue every 5 (five) minutes x 3 doses as needed for chest pain. 25 tablet 3   pantoprazole (PROTONIX) 40 MG tablet Take 1 tablet (40 mg total) by mouth daily. 90 tablet 1   sertraline (ZOLOFT) 50 MG tablet Take 50 mg by mouth daily.     No current facility-administered medications for this visit.

## 2022-05-04 ENCOUNTER — Inpatient Hospital Stay: Payer: Medicare Other | Attending: Hematology and Oncology

## 2022-05-04 ENCOUNTER — Encounter: Payer: Self-pay | Admitting: Hematology and Oncology

## 2022-05-04 ENCOUNTER — Inpatient Hospital Stay: Payer: Medicare Other | Admitting: Hematology and Oncology

## 2022-05-04 ENCOUNTER — Telehealth: Payer: Self-pay

## 2022-05-04 DIAGNOSIS — D693 Immune thrombocytopenic purpura: Secondary | ICD-10-CM

## 2022-05-04 DIAGNOSIS — E538 Deficiency of other specified B group vitamins: Secondary | ICD-10-CM | POA: Diagnosis not present

## 2022-05-04 LAB — CBC AND DIFFERENTIAL
HCT: 35 — AB (ref 36–46)
Hemoglobin: 11.2 — AB (ref 12.0–16.0)
Neutrophils Absolute: 2.16
Platelets: 37 10*3/uL — AB (ref 150–400)
WBC: 4.6

## 2022-05-04 LAB — BASIC METABOLIC PANEL
BUN: 9 (ref 4–21)
CO2: 29 — AB (ref 13–22)
Chloride: 99 (ref 99–108)
Creatinine: 0.7 (ref 0.5–1.1)
Glucose: 130
Potassium: 3.8 mEq/L (ref 3.5–5.1)
Sodium: 134 — AB (ref 137–147)

## 2022-05-04 LAB — HEPATIC FUNCTION PANEL
ALT: 23 U/L (ref 7–35)
AST: 31 (ref 13–35)
Alkaline Phosphatase: 76 (ref 25–125)
Bilirubin, Total: 1.2

## 2022-05-04 LAB — CBC
MCV: 88 (ref 81–99)
RBC: 4.03 (ref 3.87–5.11)

## 2022-05-04 LAB — COMPREHENSIVE METABOLIC PANEL
Albumin: 3.7 (ref 3.5–5.0)
Calcium: 8.4 — AB (ref 8.7–10.7)

## 2022-05-04 NOTE — Telephone Encounter (Signed)
Pt's spouse called regarding lab results. He stated that the pt followed up with her PCP. He has the pt weighing every day and giving Lasix if her weight is up. He said her weight has been stable and only once has given extra Lasix. She also saw Dr. Hinton Rao for low Platelets. She was given 5 days of Steroids and is now out of the danger zone per the pts spouse. Will send message to [facility].

## 2022-05-08 ENCOUNTER — Telehealth: Payer: Self-pay | Admitting: Hematology and Oncology

## 2022-05-08 NOTE — Telephone Encounter (Signed)
05/08/22 Spoke with patients husband and rescheduled appts on 05/18/22.

## 2022-05-11 ENCOUNTER — Encounter: Payer: Self-pay | Admitting: Hematology and Oncology

## 2022-05-11 ENCOUNTER — Inpatient Hospital Stay: Payer: Medicare Other | Admitting: Hematology and Oncology

## 2022-05-11 ENCOUNTER — Other Ambulatory Visit: Payer: Self-pay

## 2022-05-11 ENCOUNTER — Inpatient Hospital Stay: Payer: Medicare Other

## 2022-05-11 DIAGNOSIS — D693 Immune thrombocytopenic purpura: Secondary | ICD-10-CM

## 2022-05-11 DIAGNOSIS — E538 Deficiency of other specified B group vitamins: Secondary | ICD-10-CM | POA: Diagnosis not present

## 2022-05-11 LAB — BASIC METABOLIC PANEL
BUN: 10 (ref 4–21)
CO2: 26 — AB (ref 13–22)
Chloride: 101 (ref 99–108)
Creatinine: 0.8 (ref 0.5–1.1)
Glucose: 133
Potassium: 3.7 mEq/L (ref 3.5–5.1)
Sodium: 136 — AB (ref 137–147)

## 2022-05-11 LAB — HEPATIC FUNCTION PANEL
ALT: 22 U/L (ref 7–35)
AST: 31 (ref 13–35)
Alkaline Phosphatase: 75 (ref 25–125)
Bilirubin, Total: 1.2

## 2022-05-11 LAB — CBC AND DIFFERENTIAL
HCT: 37 (ref 36–46)
Hemoglobin: 11.9 — AB (ref 12.0–16.0)
Neutrophils Absolute: 2.5
Platelets: 31 10*3/uL — AB (ref 150–400)
WBC: 5.1

## 2022-05-11 LAB — CBC
MCV: 88 (ref 81–99)
RBC: 4.27 (ref 3.87–5.11)

## 2022-05-11 LAB — COMPREHENSIVE METABOLIC PANEL
Albumin: 3.9 (ref 3.5–5.0)
Calcium: 8.4 — AB (ref 8.7–10.7)

## 2022-05-11 NOTE — Progress Notes (Signed)
[facility]  [address] [place],  [place]  [address] [phone]  Clinic Day:  05/11/2022  Referring physician: Gordan Payment., MD  ASSESSMENT & PLAN:   Assessment & Plan: Immune thrombocytopenic purpura (HCC) Slowly worsening thrombocytopenia felt to be most consistent with immune thrombocytopenic purpura. She was treated with pulse Decadron 12 mg daily for 5 days.  She had an increase in her platelets with pulse dexamethasone up to 69,000.  Unfortunately, her platelets continue to slowly decrease.  She did have a fall and has bruising of her right arm and right breast.  Her husband has wanted to avoid further steroids if possible, due to the side effects of agitation and insomnia.  She is at risk for excessive bleeding and bruising with injury with a platelet count of 31,000, so I cautioned them to keep her from falling.  She will likely need another pulse of dexamethasone by next week.  We could consider a decreased dose or length of therapy due to toxicities.  I will plan to see her back in 1 week for repeat clinical assessment.  Folate deficiency She continues folic acid daily.   The patient understands the plans discussed today and is in agreement with them.  She knows to contact our office if she develops concerns prior to her next appointment.   I provided 30 minutes of face-to-face time during this encounter and > 50% was spent counseling as documented under my assessment and plan.    Adah Perl, PA-C  [facility] [address] [place] [address] Dept: [phone] Dept Fax: [phone]   Orders Placed This Encounter  Procedures   CBC and differential    This external order was created through the Results Console.   CBC    This external order was created through the Results Console.   Basic metabolic panel    This external order was created through the Results  Console.   Comprehensive metabolic panel    This external order was created through the Results Console.   Hepatic function panel    This external order was created through the Results Console.   CBC    This order was created through External Result Entry      CHIEF COMPLAINT:  CC: Immune thrombocytopenic purpura  Current Treatment: Observation  HISTORY OF PRESENT ILLNESS:  Maria Holloway is a 86 y.o. female with dementia who has worsening thrombocytopenia over the past month.  She is referred by Dr. Feliciana Rossetti for assessment and management.  We began seeing her on May 12th, at which time her platelet count was 31,000.  She has a history of CVA in February of this year, for which she was on aspirin 81 mg daily.  On April 6th, she presented to [facility] ER after a fall with significant bruising of her face.  She had mild thrombocytopenia with a platelet count of 119,000.  White blood count and hemoglobin were normal.  CT head did not reveal any intracranial injury.  There was a forehead hematoma without calvarial fracture. Chronic small vessel disease with infarcts was seen.  EKG revealed ST elevation consistent with an acute myocardial infarction.  She was transferred to [facility] and underwent cardiac catheterization with stenting of the right coronary artery.  Her folate level was mildly low at 5.6.  She was discharged home on aspirin 81 mg daily and Brilinta 90 mg twice daily.  She  was seen at the [facility] ER again on April 12th with general weakness and her platelet count remained low at 117,000.  Her hemoglobin was mildly low at 11.7 and bilirubin was 2.  She presented to the ER again on April 15th with restlessness and anxiety, which she had been experiencing since her hospitalization for MRI.  She had persistent thrombocytopenia with a platelet count of 117,000, but normal white count and hemoglobin.  The bilirubin was 1.5.  No other specific concerns were found.  She presented  to the [facility] ER again on April 28th with general weakness and dizziness. Her CT head did not reveal any acute abnormality.  CTA chest did not reveal acute pulmonary embolism.  There were multiple pulmonary nodules the largest measuring 2.8 cm.  Repeat CT or PET scan was recommended.  Small bilateral pleural effusions with mild interstitial edema was seen.  She also had elevation of her BNP.  She was admitted and diuresed.  Echocardiogram revealed mild concentric left ventricular hypertrophy with overall normal left ventricular function with an ejection fraction of 55 to 60%.  She had persistent thrombocytopenia with a platelet count of 66,000 to 79,00 during her 3-day admission.  Her hemoglobin fluctuated up and down but remained above 11.  White blood count remained normal and bilirubin was normal.  She saw Dr. Shary DecampGrisso for follow-up on May 8th at which time her platelet count had dropped to 29,000, so she was referred here.  White blood cells, hemoglobin and bilirubin remained normal.  Dr. Allyson [facility]alBerry [facility] Plavix due to thrombocytopenia, which she was to start on May 13th.   Evaluation in our office revealed persistent mild folate deficiency with folic acid of 5.2.  B12 was normal.  There was no evidence of hemolysis.  Serum protein electrophoresis did not reveal a monoclonal protein.  We felt this was most consistent with immune thrombocytopenic purpura.  She was treated with pulse dexamethasone 12 mg daily for 5 days, which is a reduced dose, as we were concerned higher doses or treatment for an extended period of time would worsen her dementia and agitation.  She was also given haloperidol 1 mg every 8 hours to use as needed for agitation.  She tolerated dexamethasone better than expected, but did require Haldol fairly regularly.  Initially, her platelet count went to 69,000, but have slowly decreased and were 37,000 on June 1.  As her platelet count remained above 20,000, we opted for  observation, as she had difficulty tolerating steroids.  INTERVAL HISTORY:  Maria Holloway is here today for repeat clinical assessment.  Unfortunately, she had another fall with severe bruising of her right forearm and right breast.  She otherwise denies new complaints.  She denies spontaneous  bleeding, such as nosebleeds or gum bleeds.  She denies fevers or chills. She denies pain. Her appetite is decreased. Her weight has decreased 5 pounds over last week .  Brilinta has been added back to her medication list, however, her husband states that she continues Plavix and is not taking Brilinta.  REVIEW OF SYSTEMS:  Review of Systems  Constitutional:  Negative for appetite change, chills, fatigue, fever and unexpected weight change.  HENT:   Negative for lump/mass, mouth sores and sore throat.   Respiratory:  Negative for cough and shortness of breath.   Cardiovascular:  Negative for chest pain and leg swelling.  Gastrointestinal:  Negative for abdominal pain, constipation, diarrhea, nausea and vomiting.  Endocrine: Negative for hot flashes.  Genitourinary:  Negative for difficulty urinating, dysuria, frequency and hematuria.   Musculoskeletal:  Negative for arthralgias, back pain and myalgias.  Skin:  Negative for rash.  Neurological:  Negative for dizziness and headaches.  Hematological:  Negative for adenopathy. Does not bruise/bleed easily.  Psychiatric/Behavioral:  Negative for depression and sleep disturbance. The patient is not nervous/anxious.      VITALS:  Blood pressure 128/61, pulse 80, temperature (!) 97.4 F (36.3 C), temperature source Oral, resp. rate 18, height 5' 4.3" (1.633 m), weight 125 lb 8 oz (56.9 kg), SpO2 96 %.  Wt Readings from Last 3 Encounters:  05/11/22 125 lb 8 oz (56.9 kg)  05/04/22 130 lb 3.2 oz (59.1 kg)  04/27/22 132 lb 4.8 oz (60 kg)    Body mass index is 21.34 kg/m.  Performance status (ECOG): 2 - Symptomatic, <50% confined to bed  PHYSICAL EXAM:   Physical Exam Vitals and nursing note reviewed.  Constitutional:      General: She is not in acute distress.    Appearance: Normal appearance.  HENT:     Head: Normocephalic and atraumatic.     Mouth/Throat:     Mouth: Mucous membranes are moist.     Pharynx: Oropharynx is clear. No oropharyngeal exudate or posterior oropharyngeal erythema.  Eyes:     General: No scleral icterus.    Extraocular Movements: Extraocular movements intact.     Conjunctiva/sclera: Conjunctivae normal.     Pupils: Pupils are equal, round, and reactive to light.  Cardiovascular:     Rate and Rhythm: Normal rate and regular rhythm.     Heart sounds: Normal heart sounds. No murmur heard.    No friction rub. No gallop.  Pulmonary:     Effort: Pulmonary effort is normal.     Breath sounds: Normal breath sounds. No wheezing, rhonchi or rales.  Abdominal:     General: There is no distension.     Palpations: Abdomen is soft. There is no hepatomegaly, splenomegaly or mass.     Tenderness: There is no abdominal tenderness.  Musculoskeletal:        General: Normal range of motion.     Cervical back: Normal range of motion and neck supple. No tenderness.     Right lower leg: No edema.     Left lower leg: No edema.  Lymphadenopathy:     Cervical: No cervical adenopathy.     Upper Body:     Right upper body: No supraclavicular or axillary adenopathy.     Left upper body: No supraclavicular or axillary adenopathy.     Lower Body: No right inguinal adenopathy. No left inguinal adenopathy.  Skin:    General: Skin is warm and dry.     Coloration: Skin is not jaundiced.     Findings: Ecchymosis (Right forearm from elbow to wrist and entire right breast) present. No rash.  Neurological:     Mental Status: She is alert and oriented to person, place, and time.     Cranial Nerves: No cranial nerve deficit.  Psychiatric:        Mood and Affect: Mood normal.        Behavior: Behavior normal.        Thought Content:  Thought content normal.     LABS:      Latest Ref Rng & Units 05/11/2022   12:00 AM 05/04/2022   12:00 AM 04/27/2022   12:00 AM  CBC  WBC  5.1     4.6  5.0      Hemoglobin 12.0 - 16.0 11.9     11.2     11.5      Hematocrit 36 - 46 37     35     36      Platelets 150 - 400 K/uL 31     37     39         This result is from an external source.      Latest Ref Rng & Units 05/11/2022   12:00 AM 05/04/2022   12:00 AM 04/27/2022   12:00 AM  CMP  BUN 4 - Creatinine 0.5 - 1.1 0.8     0.7     0.7      Sodium 137 - 147 136     134     135      Potassium 3.5 - 5.1 mEq/L 3.7     3.8     3.7      Chloride 99 - 108 101     99     101      CO2 13 - Calcium 8.7 - 10.7 8.4     8.4     8.1      Total Protein 6.3 - 8.2 g/dL   5.8      Alkaline Phos 25 - 125 75     76     68      AST 13 - 35 ALT 7 - 35 U/L This result is from an external source.     No results found for: "CEA1", "CEA" / No results found for: "CEA1", "CEA" No results found for: "PSA1" No results found for: "ZOX096" No results found for: "CAN125"  Lab Results  Component Value Date   TOTALPROTELP 6.0 04/14/2022   ALBUMINELP 3.5 04/14/2022   A1GS 0.3 04/14/2022   A2GS 0.5 04/14/2022   BETS 0.7 04/14/2022   GAMS 1.0 04/14/2022   MSPIKE Not Observed 04/14/2022   SPEI Comment 04/14/2022   No results found for: "TIBC", "FERRITIN", "IRONPCTSAT" Lab Results  Component Value Date   LDH 189 04/14/2022    STUDIES:  No results found.    HISTORY:   Past Medical History:  Diagnosis Date   Acute ST elevation myocardial infarction (STEMI) of inferior wall (HCC) 03/09/2022   Age-related osteoporosis without current pathological fracture 04/20/2021   Formatting of this note might be different from the original. Dexa 04/2021. At [facility].   Anxiety disorder 03/23/2021   Formatting of this note might be different from the original. Tried  and failed escitalopram   At high risk for falls 01/23/2022   CAD (coronary artery disease)    a. s/p STEMI in 03/2022 with DES to prox/mid RCA.   Cerebral atherosclerosis    Delirium superimposed on dementia 03/10/2022   Essential hypertension    Fall 03/09/2022   Folate deficiency 04/27/2022   GERD without esophagitis 08/18/2021   History of arterial ischemic stroke 01/23/2022   Formatting of this note might be different from the original. 01/2022   Hyperlipidemia    Malaise and  fatigue 03/23/2021   Mild cognitive impairment    Mild episode of recurrent major depressive disorder (HCC) 09/26/2021   Nocturia 09/26/2021   Osteoporosis    Primary osteoarthritis involving multiple joints 08/18/2021   Seborrheic keratosis 09/26/2021   Stroke (HCC)    Thrombocytopenia (HCC)    Vitamin B12 deficiency     Past Surgical History:  Procedure Laterality Date   CORONARY/GRAFT ACUTE MI REVASCULARIZATION N/A 03/09/2022   Procedure: Coronary/Graft Acute MI Revascularization;  Surgeon: Runell Gess, MD;  Location: [facility] INVASIVE CV LAB;  Service: Cardiovascular;  Laterality: N/A;   LEFT HEART CATH AND CORONARY ANGIOGRAPHY N/A 03/09/2022   Procedure: LEFT HEART CATH AND CORONARY ANGIOGRAPHY;  Surgeon: Runell Gess, MD;  Location: [facility] INVASIVE CV LAB;  Service: Cardiovascular;  Laterality: N/A;    Family History  Problem Relation Age of Onset   Stroke Mother    CAD Father    Heart attack Father     Social History:  reports that she has never smoked. She has never used smokeless tobacco. She reports current alcohol use. She reports that she does not use drugs.The patient is accompanied by her husband today.  Allergies: No Known Allergies  Current Medications: Current Outpatient Medications  Medication Sig Dispense Refill   alendronate (FOSAMAX) 70 MG tablet TAKE 1 TABLET (70 MG TOTAL) BY MOUTH EVERY 7 DAYS     losartan (COZAAR) 25 MG tablet Take 1 tablet by mouth daily.     aspirin EC  81 MG tablet Take 81 mg by mouth daily. Swallow whole.     atorvastatin (LIPITOR) 80 MG tablet Take 80 mg by mouth at bedtime.     carvedilol (COREG) 6.25 MG tablet Take 6.25 mg by mouth 2 (two) times daily.     clopidogrel (PLAVIX) 75 MG tablet Take 75 mg by mouth daily.     cyanocobalamin 1000 MCG tablet Take 1,000 mcg by mouth daily.     donepezil (ARICEPT) 5 MG tablet Take 5 mg by mouth daily.     Folic Acid (FOLATE PO)      furosemide (LASIX) 20 MG tablet Take 20 mg by mouth as needed for fluid or edema.     haloperidol (HALDOL) 1 MG tablet Take 1 tablet (1 mg total) by mouth every 8 (eight) hours as needed for agitation. 60 tablet 1   losartan (COZAAR) 50 MG tablet Take 25 mg by mouth daily.     metoprolol tartrate (LOPRESSOR) 25 MG tablet Take 1 tablet (25 mg total) by mouth 2 (two) times daily. 180 tablet 1   nitroGLYCERIN (NITROSTAT) 0.4 MG SL tablet Place 1 tablet (0.4 mg total) under the tongue every 5 (five) minutes x 3 doses as needed for chest pain. 25 tablet 3   pantoprazole (PROTONIX) 40 MG tablet Take 1 tablet (40 mg total) by mouth daily. 90 tablet 1   sertraline (ZOLOFT) 50 MG tablet Take 50 mg by mouth daily.     No current facility-administered medications for this visit.

## 2022-05-11 NOTE — Assessment & Plan Note (Addendum)
Slowly worsening thrombocytopenia felt to be most consistent with immune thrombocytopenic purpura. She was treated with pulse Decadron 12 mg daily for 5 days.  She had an increase in her platelets with pulse dexamethasone up to 69,000.  Unfortunately, her platelets continue to slowly decrease.  She did have a fall and has bruising of her right arm and right breast.  Her husband has wanted to avoid further steroids if possible, due to the side effects of agitation and insomnia.  She is at risk for excessive bleeding and bruising with injury with a platelet count of 31,000, so I cautioned them to keep her from falling.  She will likely need another pulse of dexamethasone by next week.  We could consider a decreased dose or length of therapy due to toxicities.  I will plan to see her back in 1 week for repeat clinical assessment.

## 2022-05-11 NOTE — Assessment & Plan Note (Signed)
She continues folic acid daily. 

## 2022-05-15 NOTE — Progress Notes (Addendum)
[facility]  [address] [place],  [place]  [id number] [phone]  Clinic Day:  05/18/2022  Referring physician: Gordan PaymentGrisso, Greg A., MD  ASSESSMENT & PLAN:   Assessment & Plan: Immune thrombocytopenic purpura (HCC) Slowly worsening thrombocytopenia felt to be most consistent with immune thrombocytopenic purpura. She was treated with pulse Decadron 12 mg daily for 5 days.  She had an increase in her platelets with pulse dexamethasone up to 69,000.  Unfortunately, her platelets have been slowly decreasing, but they are slightly improved today.  We will continue to monitor her to avoid further steroids if possible, due to the side effects of agitation and insomnia.  I will plan to see her back in 1 week for repeat clinical assessment.   The patient understands the plans discussed today and is in agreement with them.  She knows to contact our office if she develops concerns prior to her next appointment.   I provided 10 minutes of face-to-face time during this encounter and > 50% was spent counseling as documented under my assessment and plan.    Adah PerlKelli A Tareq Dwan, PA-C  [facility] [place] [facility] [address] [place] [id number] Dept: [phone] Dept Fax: [phone]   Orders Placed This Encounter  Procedures   CBC and differential    This external order was created through the Results Console.   CBC    This external order was created through the Results Console.   CBC    This order was created through External Result Entry      CHIEF COMPLAINT:  CC: Immune thrombocytopenic purpura  Current Treatment: Observation  HISTORY OF PRESENT ILLNESS:  Maria Holloway is a 86 y.o. female with dementia who has worsening thrombocytopenia over the past month.  She is referred by Dr. Feliciana RossettiGreg Grisso for assessment and management.  We began seeing her on May 12th, at which time her platelet count was 31,000.   She has a history of CVA in February of this year, for which she was on aspirin 81 mg daily.  On April 6th, she [facility] after a fall with significant bruising of her face.  She had mild thrombocytopenia with a platelet count of 119,000.  White blood count and hemoglobin were normal.  CT head did not reveal any intracranial injury.  There was a forehead hematoma without calvarial fracture. Chronic small vessel disease with infarcts was seen.  EKG revealed ST elevation consistent with an acute myocardial infarction.  [facility] and underwent cardiac catheterization with stenting of the right coronary artery.  Her folate level was mildly low at 5.6.  She was discharged home on aspirin 81 mg daily and Brilinta 90 mg twice daily.  [facility] on April 12th with general weakness and her platelet count remained low at 117,000.  Her hemoglobin was mildly low at 11.7 and bilirubin was 2.  She presented to the ER again on April 15th with restlessness and anxiety, which she had been experiencing since her hospitalization for MRI.  She had persistent thrombocytopenia with a platelet count of 117,000, but normal white count and hemoglobin.  The bilirubin was 1.5.  No other specific concerns were found.  [facility] on April 28th with general weakness and dizziness. Her CT head did not reveal any acute abnormality.  CTA chest did not reveal acute  pulmonary embolism.  There were multiple pulmonary nodules the largest measuring 2.8 cm.  Repeat CT or PET scan was recommended.  Small bilateral pleural effusions with mild interstitial edema was seen.  She also had elevation of her BNP.  She was admitted and diuresed.  Echocardiogram revealed mild concentric left ventricular hypertrophy with overall normal left ventricular function with an ejection fraction of 55 to 60%.  She had persistent thrombocytopenia with a platelet  count of 66,000 to 79,00 during her 3-day admission.  Her hemoglobin fluctuated up and down but remained above 11.  White blood count remained normal and bilirubin was normal.  She saw Dr. Shary Decamp for follow-up on May 8th at which time her platelet count had dropped to 29,000, so she was referred here.  White blood cells, hemoglobin and bilirubin remained normal.  Dr. Allyson Sabal switched from [facility] to Plavix due to thrombocytopenia, which she was to start on May 13th.   Evaluation in our office revealed persistent mild folate deficiency with folic acid of 5.2.  B12 was normal.  There was no evidence of hemolysis.  Serum protein electrophoresis did not reveal a monoclonal protein.  We felt this was most consistent with immune thrombocytopenic purpura.  She was treated with pulse dexamethasone 12 mg daily for 5 days, which is a reduced dose, as we were concerned higher doses or treatment for an extended period of time would worsen her dementia and agitation.  She was also given haloperidol 1 mg every 8 hours to use as needed for agitation.  She tolerated dexamethasone better than expected, but did require Haldol fairly regularly.  Initially, her platelet count went up to 69,000, but since that time, it had steadily decreased.  As her platelet count was about 20,000, we opted for observation due to difficulty tolerating the dexamethasone.  We could try a lower dose or shorter course of therapy, when she requires treatment again.  She also has borderline anemia.  She has been steadily losing weight.  INTERVAL HISTORY:  [name] is here today for repeat clinical assessment.  She denies abnormal bleeding.  She denies further falls, but remains unsteady on her feet.  She continues to use a cane.  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  She denies fevers or chills. She denies pain. Her appetite is decreased and her husband is working to try to get her to eat more, as well as use  nutritional supplements. Her weight has decreased 1 pounds over last week .  She continues folic acid daily.  REVIEW OF SYSTEMS:  Review of Systems  Constitutional:  Negative for appetite change, chills, fatigue, fever and unexpected weight change.  HENT:   Negative for lump/mass, mouth sores and sore throat.   Respiratory:  Negative for cough and shortness of breath.   Cardiovascular:  Negative for chest pain and leg swelling.  Gastrointestinal:  Negative for abdominal pain, constipation, diarrhea, nausea and vomiting.  Endocrine: Negative for hot flashes.  Genitourinary:  Negative for difficulty urinating, dysuria, frequency and hematuria.   Musculoskeletal:  Positive for gait problem (Unsteady, walks with a cane). Negative for arthralgias, back pain and myalgias.  Skin:  Negative for rash.  Neurological:  Positive for gait problem (Unsteady, walks with a cane). Negative for dizziness and headaches.  Hematological:  Negative for adenopathy. Does not bruise/bleed easily.  Psychiatric/Behavioral:  Positive for depression (Due to memory difficulties). Negative for sleep disturbance. The patient is not nervous/anxious.  VITALS:  Blood pressure 136/68, pulse 86, temperature 97.8 F (36.6 C), temperature source Oral, resp. rate 18, height 5' 4.3" (1.633 m), weight 124 lb 12.8 oz (56.6 kg), SpO2 95 %.  Wt Readings from Last 3 Encounters:  05/18/22 124 lb 12.8 oz (56.6 kg)  05/11/22 125 lb 8 oz (56.9 kg)  05/04/22 130 lb 3.2 oz (59.1 kg)    Body mass index is 21.22 kg/m.  Performance status (ECOG): 2 - Symptomatic, <50% confined to bed  PHYSICAL EXAM:  Physical Exam Vitals and nursing note reviewed.  Constitutional:      General: She is not in acute distress.    Appearance: Normal appearance.  HENT:     Head: Normocephalic and atraumatic.     Mouth/Throat:     Mouth: Mucous membranes are moist.     Pharynx: Oropharynx is clear. No oropharyngeal exudate or posterior  oropharyngeal erythema.  Eyes:     General: No scleral icterus.    Extraocular Movements: Extraocular movements intact.     Conjunctiva/sclera: Conjunctivae normal.     Pupils: Pupils are equal, round, and reactive to light.  Cardiovascular:     Rate and Rhythm: Normal rate and regular rhythm.     Heart sounds: Normal heart sounds. No murmur heard.    No friction rub. No gallop.  Pulmonary:     Effort: Pulmonary effort is normal.     Breath sounds: Normal breath sounds. No wheezing, rhonchi or rales.  Abdominal:     General: There is no distension.     Palpations: Abdomen is soft. There is no hepatomegaly, splenomegaly or mass.     Tenderness: There is no abdominal tenderness.  Musculoskeletal:        General: Normal range of motion.     Cervical back: Normal range of motion and neck supple. No tenderness.     Right lower leg: No edema.     Left lower leg: No edema.  Lymphadenopathy:     Cervical: No cervical adenopathy.     Upper Body:     Right upper body: No supraclavicular or axillary adenopathy.     Left upper body: No supraclavicular or axillary adenopathy.     Lower Body: No right inguinal adenopathy. No left inguinal adenopathy.  Skin:    General: Skin is warm and dry.     Coloration: Skin is not jaundiced.     Findings: Bruising (Multiple, in various stages of healing) present. No rash.  Neurological:     Mental Status: She is alert and oriented to person, place, and time.     Cranial Nerves: No cranial nerve deficit.  Psychiatric:        Mood and Affect: Mood normal.        Behavior: Behavior normal.        Thought Content: Thought content normal.    LABS:      Latest Ref Rng & Units 05/18/2022   12:00 AM 05/11/2022   12:00 AM 05/04/2022   12:00 AM  CBC  WBC  3.8     5.1     4.6      Hemoglobin 12.0 - 16.0 11.7     11.9     11.2      Hematocrit 36 - 46 37     37     35      Platelets 150 - 400 K/uL 36     31     37  This result is from an external  source.      Latest Ref Rng & Units 05/11/2022   12:00 AM 05/04/2022   12:00 AM 04/27/2022   12:00 AM  CMP  BUN 4 - 21 10     9     9       Creatinine 0.5 - 1.1 0.8     0.7     0.7      Sodium 137 - 147 136     134     135      Potassium 3.5 - 5.1 mEq/L 3.7     3.8     3.7      Chloride 99 - 108 101     99     101      CO2 13 - 22 26     29     27       Calcium 8.7 - 10.7 8.4     8.4     8.1      Total Protein 6.3 - 8.2 g/dL   5.8      Alkaline Phos 25 - 125 75     76     68      AST 13 - 35 31     31     28       ALT 7 - 35 U/L 22     23     24          This result is from an external source.     No results found for: "CEA1", "CEA" / No results found for: "CEA1", "CEA" No results found for: "PSA1" No results found for: " " No results found for: "CAN125"  Lab Results  Component Value Date   TOTALPROTELP 6.0 04/14/2022   ALBUMINELP 3.5 04/14/2022   A1GS 0.3 04/14/2022   A2GS 0.5 04/14/2022   BETS 0.7 04/14/2022   GAMS 1.0 04/14/2022   MSPIKE Not Observed 04/14/2022   SPEI Comment 04/14/2022   No results found for: "TIBC", "FERRITIN", "IRONPCTSAT" Lab Results  Component Value Date   LDH 189 04/14/2022    STUDIES:  No results found.    HISTORY:   Past Medical History:  Diagnosis Date   Acute ST elevation myocardial infarction (STEMI) of inferior wall (HCC) 03/09/2022   Age-related osteoporosis without current pathological fracture 04/20/2021   Formatting of this note might be different from the original. Dexa 04/2021. At [facility].   Anxiety disorder 03/23/2021   Formatting of this note might be different from the original. Tried and failed escitalopram   At high risk for falls 01/23/2022   CAD (coronary artery disease)    a. s/p STEMI in 03/2022 with DES to prox/mid RCA.   Cerebral atherosclerosis    Delirium superimposed on dementia 03/10/2022   Essential hypertension    Fall 03/09/2022   Folate deficiency 04/27/2022   GERD without esophagitis 08/18/2021    History of arterial ischemic stroke 01/23/2022   Formatting of this note might be different from the original. 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/2021   Nocturia 09/26/2021   Osteoporosis    Primary osteoarthritis involving multiple joints 08/18/2021   Seborrheic keratosis 09/26/2021   Stroke (HCC)    Thrombocytopenia (HCC)    Vitamin B12 deficiency     Past Surgical History:  Procedure Laterality Date   CORONARY/GRAFT  ACUTE MI REVASCULARIZATION N/A 03/09/2022   Procedure: Coronary/Graft Acute MI Revascularization;  Surgeon: Runell Gess, MD;  Location: [facility] INVASIVE CV LAB;  Service: Cardiovascular;  Laterality: N/A;   LEFT HEART CATH AND CORONARY ANGIOGRAPHY N/A 03/09/2022   Procedure: LEFT HEART CATH AND CORONARY ANGIOGRAPHY;  Surgeon: Runell Gess, MD;  Location: [facility] INVASIVE CV LAB;  Service: Cardiovascular;  Laterality: N/A;    Family History  Problem Relation Age of Onset   Stroke Mother    CAD Father    Heart attack Father     Social History:  reports that she has never smoked. She has never used smokeless tobacco. She reports current alcohol use. She reports that she does not use drugs.The patient is accompanied by her husband today.  Allergies: No Known Allergies  Current Medications: Current Outpatient Medications  Medication Sig Dispense Refill   aspirin EC 81 MG tablet Take 81 mg by mouth daily. Swallow whole.     atorvastatin (LIPITOR) 80 MG tablet Take 80 mg by mouth at bedtime.     carvedilol (COREG) 6.25 MG tablet Take 6.25 mg by mouth 2 (two) times daily.     clopidogrel (PLAVIX) 75 MG tablet Take 75 mg by mouth daily.     cyanocobalamin 1000 MCG tablet Take 1,000 mcg by mouth daily.     donepezil (ARICEPT) 5 MG tablet Take 5 mg by mouth daily.     Folic Acid (FOLATE PO)      furosemide (LASIX) 20 MG tablet Take 20 mg by mouth as needed  for fluid or edema.     haloperidol (HALDOL) 1 MG tablet Take 1 tablet (1 mg total) by mouth every 8 (eight) hours as needed for agitation. 60 tablet 1   losartan (COZAAR) 25 MG tablet Take 1 tablet by mouth daily.     metoprolol tartrate (LOPRESSOR) 25 MG tablet Take 1 tablet (25 mg total) by mouth 2 (two) times daily. 180 tablet 1   nitroGLYCERIN (NITROSTAT) 0.4 MG SL tablet Place 1 tablet (0.4 mg total) under the tongue every 5 (five) minutes x 3 doses as needed for chest pain. 25 tablet 3   pantoprazole (PROTONIX) 40 MG tablet Take 1 tablet (40 mg total) by mouth daily. 90 tablet 1   sertraline (ZOLOFT) 50 MG tablet Take 50 mg by mouth daily.     No current facility-administered medications for this visit.

## 2022-05-15 NOTE — Assessment & Plan Note (Addendum)
Slowly worsening thrombocytopenia felt to be most consistent with immune thrombocytopenic purpura. She was treated with pulse Decadron 12 mg daily for 5 days.  She had an increase in her platelets with pulse dexamethasone up to 69,000.  Unfortunately, her platelets have been slowly decreasing, but they are slightly improved today.  We will continue to monitor her to avoid further steroids if possible, due to the side effects of agitation and insomnia.  I will plan to see her back in 1 week for repeat clinical assessment.

## 2022-05-18 ENCOUNTER — Encounter: Payer: Self-pay | Admitting: Hematology and Oncology

## 2022-05-18 ENCOUNTER — Other Ambulatory Visit: Payer: Self-pay

## 2022-05-18 ENCOUNTER — Ambulatory Visit: Payer: Medicare Other | Admitting: Hematology and Oncology

## 2022-05-18 ENCOUNTER — Inpatient Hospital Stay: Payer: Medicare Other | Admitting: Hematology and Oncology

## 2022-05-18 ENCOUNTER — Inpatient Hospital Stay: Payer: Medicare Other

## 2022-05-18 ENCOUNTER — Other Ambulatory Visit: Payer: Medicare Other

## 2022-05-18 DIAGNOSIS — D693 Immune thrombocytopenic purpura: Secondary | ICD-10-CM | POA: Diagnosis not present

## 2022-05-18 LAB — CBC AND DIFFERENTIAL
HCT: 37 (ref 36–46)
Hemoglobin: 11.7 — AB (ref 12.0–16.0)
Neutrophils Absolute: 1.86
Platelets: 36 10*3/uL — AB (ref 150–400)
WBC: 3.8

## 2022-05-18 LAB — CBC
MCV: 88 (ref 81–99)
RBC: 4.19 (ref 3.87–5.11)

## 2022-05-24 NOTE — Progress Notes (Signed)
[facility]  [address] [place],  [place]  [address] [phone]  Clinic Day: 05/25/22  Referring physician: Gordan Payment., MD  ASSESSMENT & PLAN:   Assessment & Plan: Immune thrombocytopenic purpura (HCC) Slowly worsening thrombocytopenia felt to be most consistent with immune thrombocytopenic purpura. She was treated with pulse Decadron 12 mg daily for 5 days.  She had an increase in her platelets with pulse dexamethasone up to 69,000.  Unfortunately, her platelets have been slowly decreasing.  We will continue to monitor her to avoid further steroids if possible, due to the side effects of agitation and insomnia.    I do not feel she could sit still for IVIG infusions same for rituximab.  She is too elderly and frail to consider splenectomy.  We will therefore continue to monitor her and see her on a weekly basis with CBC and CMP.The patient's husband understands the plans discussed today and is in agreement with them.  They know to contact our office if she develops concerns prior to her next appointment.   I provided 15 minutes of face-to-face time during this encounter and > 50% was spent counseling as documented under my assessment and plan.    Dellia Beckwith, MD  [facility] AT [facility] [address] [place] [address] Dept: [phone] Dept Fax: [phone]   Orders Placed This Encounter  Procedures   CBC and differential    This external order was created through the Results Console.   CBC    This external order was created through the Results Console.   Basic metabolic panel    This external order was created through the Results Console.   Comprehensive metabolic panel    This external order was created through the Results Console.   Hepatic function panel    This external order was created through the Results Console.      CHIEF COMPLAINT:  CC: Immune  thrombocytopenic purpura  Current Treatment: Observation  HISTORY OF PRESENT ILLNESS:  Maria Holloway is a 86 y.o. female with dementia who has worsening thrombocytopenia over the past month.  She is referred by Dr. Feliciana Rossetti for assessment and management.  We began seeing her on May 12th, at which time her platelet count was 31,000.  She has a history of CVA in February of this year, for which she was on aspirin 81 mg daily.  On April 6th, she presented to [facility] ER after a fall with significant bruising of her face.  She had mild thrombocytopenia with a platelet count of 119,000.  White blood count and hemoglobin were normal.  CT head did not reveal any intracranial injury.  There was a forehead hematoma without calvarial fracture. Chronic small vessel disease with infarcts was seen.  EKG revealed ST elevation consistent with an acute myocardial infarction.  She was transferred to [facility] and underwent cardiac catheterization with stenting of the right coronary artery.  Her folate level was mildly low at 5.6.  She was discharged home on aspirin 81 mg daily and Brilinta 90 mg twice daily.  She was seen at the [facility] ER again on April 12th with general weakness and her platelet count remained low at 117,000.  Her hemoglobin was mildly low at 11.7 and bilirubin was 2.  She presented to the ER again on April 15th with restlessness and anxiety, which she had been experiencing since her hospitalization for MRI.  She had  persistent thrombocytopenia with a platelet count of 117,000, but normal white count and hemoglobin.  The bilirubin was 1.5.  No other specific concerns were found.  She presented to the [facility] ER again on April 28th with general weakness and dizziness. Her CT head did not reveal any acute abnormality.  CTA chest did not reveal acute pulmonary embolism.  There were multiple pulmonary nodules the largest measuring 2.8 cm.  Repeat CT or PET scan was recommended.  Small  bilateral pleural effusions with mild interstitial edema was seen.  She also had elevation of her BNP.  She was admitted and diuresed.  Echocardiogram revealed mild concentric left ventricular hypertrophy with overall normal left ventricular function with an ejection fraction of 55 to 60%.  She had persistent thrombocytopenia with a platelet count of 66,000 to 79,00 during her 3-day admission.  Her hemoglobin fluctuated up and down but remained above 11.  White blood count remained normal and bilirubin was normal.  She saw Dr. Shary DecampGrisso for follow-up on May 8th at which time her platelet count had dropped to 29,000, so she was referred here.  White blood cells, hemoglobin and bilirubin remained normal.  Dr. Allyson [facility]alBerry [facility] Plavix due to thrombocytopenia, which she was to start on May 13th.   Evaluation in our office revealed persistent mild folate deficiency with folic acid of 5.2.  B12 was normal.  There was no evidence of hemolysis.  Serum protein electrophoresis did not reveal a monoclonal protein.  We felt this was most consistent with immune thrombocytopenic purpura.  She was treated with pulse dexamethasone 12 mg daily for 5 days, which is a reduced dose, as we were concerned higher doses or treatment for an extended period of time would worsen her dementia and agitation.  She was also given haloperidol 1 mg every 8 hours to use as needed for agitation.  She tolerated dexamethasone better than expected, but did require Haldol fairly regularly.  Initially, her platelet count went up to 69,000, but since that time, it had steadily decreased.  As her platelet count was about 20,000, we opted for observation due to difficulty tolerating the dexamethasone.  We could try a lower dose or shorter course of therapy, when she requires treatment again.  She also has borderline anemia.  She has been steadily losing weight.  INTERVAL HISTORY:  Meriam SpragueBeverly is here today for repeat clinical assessment.  She  denies abnormal bleeding.  She denies further falls, but remains unsteady on her feet.  She continues to use a cane.  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  She denies fevers or chills. She denies pain. Her appetite is decreased and her husband is working to try to get her to eat more, as well as use nutritional supplements. Her weight has decreased 1 pounds over last week .  She continues folic acid daily.  REVIEW OF SYSTEMS:  Review of Systems  Constitutional:  Negative for appetite change, chills, fatigue, fever and unexpected weight change.  HENT:   Negative for lump/mass, mouth sores and sore throat.   Respiratory:  Negative for cough and shortness of breath.   Cardiovascular:  Negative for chest pain and leg swelling.  Gastrointestinal:  Negative for abdominal pain, constipation, diarrhea, nausea and vomiting.  Endocrine: Negative for hot flashes.  Genitourinary:  Negative for difficulty urinating, dysuria, frequency and hematuria.   Musculoskeletal:  Positive for gait problem (Unsteady, walks with a cane). Negative for arthralgias,  back pain and myalgias.  Skin:  Negative for rash.  Neurological:  Positive for gait problem (Unsteady, walks with a cane). Negative for dizziness and headaches.  Hematological:  Negative for adenopathy. Does not bruise/bleed easily.  Psychiatric/Behavioral:  Positive for depression (Due to memory difficulties). Negative for sleep disturbance. The patient is not nervous/anxious.      VITALS:  Blood pressure (!) 144/68, pulse 80, temperature 98.1 F (36.7 C), temperature source Oral, resp. rate 18, height 5' 4.3" (1.633 m), weight 124 lb 9.6 oz (56.5 kg), SpO2 93 %.  Wt Readings from Last 3 Encounters:  06/22/22 118 lb 8 oz (53.8 kg)  06/15/22 119 lb 3.2 oz (54.1 kg)  06/14/22 121 lb 1.3 oz (54.9 kg)    Body mass index is 21.19 kg/m.  Performance status (ECOG): 2 - Symptomatic, <50% confined to bed  PHYSICAL EXAM:   Physical Exam Vitals and nursing note reviewed.  Constitutional:      General: She is not in acute distress.    Appearance: Normal appearance.  HENT:     Head: Normocephalic and atraumatic.     Mouth/Throat:     Mouth: Mucous membranes are moist.     Pharynx: Oropharynx is clear. No oropharyngeal exudate or posterior oropharyngeal erythema.  Eyes:     General: No scleral icterus.    Extraocular Movements: Extraocular movements intact.     Conjunctiva/sclera: Conjunctivae normal.     Pupils: Pupils are equal, round, and reactive to light.  Cardiovascular:     Rate and Rhythm: Normal rate and regular rhythm.     Heart sounds: Normal heart sounds. No murmur heard.    No friction rub. No gallop.  Pulmonary:     Effort: Pulmonary effort is normal.     Breath sounds: Normal breath sounds. No wheezing, rhonchi or rales.  Abdominal:     General: There is no distension.     Palpations: Abdomen is soft. There is no hepatomegaly, splenomegaly or mass.     Tenderness: There is no abdominal tenderness.  Musculoskeletal:        General: Normal range of motion.     Cervical back: Normal range of motion and neck supple. No tenderness.     Right lower leg: No edema.     Left lower leg: No edema.  Lymphadenopathy:     Cervical: No cervical adenopathy.     Upper Body:     Right upper body: No supraclavicular or axillary adenopathy.     Left upper body: No supraclavicular or axillary adenopathy.     Lower Body: No right inguinal adenopathy. No left inguinal adenopathy.  Skin:    General: Skin is warm and dry.     Coloration: Skin is not jaundiced.     Findings: Bruising (Multiple, in various stages of healing) present. No rash.  Neurological:     Mental Status: She is alert and oriented to person, place, and time.     Cranial Nerves: No cranial nerve deficit.  Psychiatric:        Mood and Affect: Mood normal.        Behavior: Behavior normal.        Thought Content: Thought content  normal.    LABS:      Latest Ref Rng & Units 06/22/2022   12:00 AM 06/15/2022   12:00 AM 06/13/2022   12:00 AM  CBC  WBC  5.5     8.5     7.5  Hemoglobin 12.0 - 16.0 12.2     13.1     12.7      Hematocrit 36 - 46 38     41     39      Platelets 150 - 400 K/uL 11     55     17         This result is from an external source.      Latest Ref Rng & Units 06/22/2022   12:00 AM 06/15/2022   12:00 AM 06/01/2022   12:00 AM  CMP  BUN 4 - 21 13     17     9       Creatinine 0.5 - 1.1 0.7     0.8     0.7      Sodium 137 - 147 136     137     132      Potassium 3.5 - 5.1 mEq/L 4.1     3.6     4.5      Chloride 99 - 108 104     101     100      CO2 13 - 22 26     31     24       Calcium 8.7 - 10.7 8.9     9.1     9.1      Alkaline Phos 25 - 125 57     52     56      AST 13 - 35 21     28     27       ALT 7 - 35 U/L 18     25     15          This result is from an external source.     No results found for: "CEA1", "CEA" / No results found for: "CEA1", "CEA" No results found for: "PSA1" No results found for: " " No results found for: "CAN125"  Lab Results  Component Value Date   TOTALPROTELP 6.0 04/14/2022   ALBUMINELP 3.5 04/14/2022   A1GS 0.3 04/14/2022   A2GS 0.5 04/14/2022   BETS 0.7 04/14/2022   GAMS 1.0 04/14/2022   MSPIKE Not Observed 04/14/2022   SPEI Comment 04/14/2022   No results found for: "TIBC", "FERRITIN", "IRONPCTSAT" Lab Results  Component Value Date   LDH 189 04/14/2022    STUDIES:  No results found.    HISTORY:   Past Medical History:  Diagnosis Date   Acute ST elevation myocardial infarction (STEMI) of inferior wall (HCC) 03/09/2022   Age-related osteoporosis without current pathological fracture 04/20/2021   Formatting of this note might be different from the original. Dexa 04/2021. [facility]   Anxiety disorder 03/23/2021   Formatting of this note might be different from the original. Tried and failed escitalopram   At high risk for falls  01/23/2022   CAD (coronary artery disease)    a. s/p STEMI in 03/2022 with DES to prox/mid RCA.   Cerebral atherosclerosis    Delirium superimposed on dementia 03/10/2022   Essential hypertension    Fall 03/09/2022   Folate deficiency 04/27/2022   GERD without esophagitis 08/18/2021   History of arterial ischemic stroke 01/23/2022   Formatting of this note might be different from the original. 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/2021  Nocturia 09/26/2021   Osteoporosis    Primary osteoarthritis involving multiple joints 08/18/2021   Seborrheic keratosis 09/26/2021   Stroke (HCC)    Thrombocytopenia (HCC)    Vitamin B12 deficiency     Past Surgical History:  Procedure Laterality Date   CORONARY/GRAFT ACUTE MI REVASCULARIZATION N/A 03/09/2022   Procedure: Coronary/Graft Acute MI Revascularization;  Surgeon: Runell Gess, MD;  Location: [facility] INVASIVE CV [facility];  Service: Cardiovascular;  Laterality: N/A;   LEFT HEART CATH AND CORONARY ANGIOGRAPHY N/A 03/09/2022   Procedure: LEFT HEART CATH AND CORONARY ANGIOGRAPHY;  Surgeon: Runell Gess, MD;  Location: [facility] INVASIVE CV LAB;  Service: Cardiovascular;  Laterality: N/A;    Family History  Problem Relation Age of Onset   Stroke Mother    CAD Father    Heart attack Father     Social History:  reports that she has never smoked. She has never used smokeless tobacco. She reports current alcohol use. She reports that she does not use drugs.The patient is accompanied by her husband today.  Allergies: No Known Allergies  Current Medications: Current Outpatient Medications  Medication Sig Dispense Refill   alendronate (FOSAMAX) 70 MG tablet Take 70 mg by mouth once a week.     aspirin EC 81 MG tablet Take 81 mg by mouth daily. Swallow whole. (Patient not taking: Reported on 06/15/2022)     atorvastatin (LIPITOR) 80 MG  tablet Take 80 mg by mouth at bedtime.     carvedilol (COREG) 6.25 MG tablet Take 6.25 mg by mouth 2 (two) times daily.     clopidogrel (PLAVIX) 75 MG tablet Take 75 mg by mouth daily. (Patient not taking: Reported on 06/15/2022)     cyanocobalamin 1000 MCG tablet Take 1,000 mcg by mouth daily.     dexamethasone (DECADRON) 4 MG tablet Take 1 tablet (4 mg total) by mouth 3 (three) times daily. 9 tablet 1   donepezil (ARICEPT) 5 MG tablet Take 5 mg by mouth daily.     Folic Acid (FOLATE PO)      furosemide (LASIX) 20 MG tablet Take 20 mg by mouth as needed for fluid or edema.     haloperidol (HALDOL) 1 MG tablet TAKE 1 TABLET (1 MG TOTAL) BY MOUTH EVERY 8 (EIGHT) HOURS AS NEEDED FOR AGITATION. 60 tablet 1   losartan (COZAAR) 25 MG tablet Take 1 tablet by mouth daily.     metoprolol tartrate (LOPRESSOR) 25 MG tablet Take 1 tablet (25 mg total) by mouth 2 (two) times daily. 180 tablet 1   nitroGLYCERIN (NITROSTAT) 0.4 MG SL tablet Place 1 tablet (0.4 mg total) under the tongue every 5 (five) minutes x 3 doses as needed for chest pain. 25 tablet 3   pantoprazole (PROTONIX) 40 MG tablet Take 1 tablet (40 mg total) by mouth daily. 90 tablet 1   sertraline (ZOLOFT) 50 MG tablet Take 50 mg by mouth daily.     No current facility-administered medications for this visit.

## 2022-05-25 ENCOUNTER — Telehealth: Payer: Self-pay | Admitting: Oncology

## 2022-05-25 ENCOUNTER — Inpatient Hospital Stay (INDEPENDENT_AMBULATORY_CARE_PROVIDER_SITE_OTHER): Payer: Medicare Other | Admitting: Oncology

## 2022-05-25 ENCOUNTER — Inpatient Hospital Stay: Payer: Medicare Other

## 2022-05-25 ENCOUNTER — Encounter: Payer: Self-pay | Admitting: Oncology

## 2022-05-25 VITALS — BP 144/68 | HR 80 | Temp 98.1°F | Resp 18 | Ht 64.3 in | Wt 124.6 lb

## 2022-05-25 DIAGNOSIS — D693 Immune thrombocytopenic purpura: Secondary | ICD-10-CM | POA: Diagnosis not present

## 2022-05-25 LAB — COMPREHENSIVE METABOLIC PANEL
Albumin: 4.1 (ref 3.5–5.0)
Calcium: 8.6 — AB (ref 8.7–10.7)

## 2022-05-25 LAB — CBC: RBC: 4.3 (ref 3.87–5.11)

## 2022-05-25 LAB — BASIC METABOLIC PANEL
BUN: 8 (ref 4–21)
CO2: 23 — AB (ref 13–22)
Chloride: 102 (ref 99–108)
Creatinine: 0.8 (ref 0.5–1.1)
Glucose: 108
Potassium: 4.2 mEq/L (ref 3.5–5.1)
Sodium: 134 — AB (ref 137–147)

## 2022-05-25 LAB — CBC AND DIFFERENTIAL
HCT: 38 (ref 36–46)
Hemoglobin: 12.1 (ref 12.0–16.0)
Neutrophils Absolute: 3.25
Platelets: 32 10*3/uL — AB (ref 150–400)
WBC: 5.5

## 2022-05-25 LAB — HEPATIC FUNCTION PANEL
ALT: 17 U/L (ref 7–35)
AST: 26 (ref 13–35)
Alkaline Phosphatase: 65 (ref 25–125)
Bilirubin, Total: 0.9

## 2022-05-25 NOTE — Telephone Encounter (Signed)
Per 05/25/22 los next appt scheduled and confirmed with patient 

## 2022-05-26 ENCOUNTER — Encounter: Payer: Self-pay | Admitting: Hematology and Oncology

## 2022-06-01 ENCOUNTER — Encounter: Payer: Self-pay | Admitting: Oncology

## 2022-06-01 ENCOUNTER — Inpatient Hospital Stay: Payer: Medicare Other | Admitting: Oncology

## 2022-06-01 ENCOUNTER — Inpatient Hospital Stay: Payer: Medicare Other

## 2022-06-01 ENCOUNTER — Other Ambulatory Visit: Payer: Self-pay | Admitting: Oncology

## 2022-06-01 VITALS — BP 170/83 | HR 86 | Temp 97.7°F | Resp 18 | Ht 64.3 in | Wt 124.9 lb

## 2022-06-01 DIAGNOSIS — D693 Immune thrombocytopenic purpura: Secondary | ICD-10-CM

## 2022-06-01 LAB — BASIC METABOLIC PANEL
BUN: 9 (ref 4–21)
CO2: 24 — AB (ref 13–22)
Chloride: 100 (ref 99–108)
Creatinine: 0.7 (ref 0.5–1.1)
Glucose: 111
Potassium: 4.5 mEq/L (ref 3.5–5.1)
Sodium: 132 — AB (ref 137–147)

## 2022-06-01 LAB — HEPATIC FUNCTION PANEL
ALT: 15 U/L (ref 7–35)
AST: 27 (ref 13–35)
Alkaline Phosphatase: 56 (ref 25–125)
Bilirubin, Total: 6.6

## 2022-06-01 LAB — CBC AND DIFFERENTIAL
HCT: 36 (ref 36–46)
Hemoglobin: 11.7 — AB (ref 12.0–16.0)
Neutrophils Absolute: 2.91
Platelets: 23 10*3/uL — AB (ref 150–400)
WBC: 4.7

## 2022-06-01 LAB — COMPREHENSIVE METABOLIC PANEL
Albumin: 4 (ref 3.5–5.0)
Calcium: 9.1 (ref 8.7–10.7)

## 2022-06-01 LAB — CBC: RBC: 4.1 (ref 3.87–5.11)

## 2022-06-01 MED ORDER — DEXAMETHASONE 4 MG PO TABS: 4.0000 mg | ORAL_TABLET | Freq: Three times a day (TID) | ORAL | 1 refills | Status: DC

## 2022-06-01 NOTE — Progress Notes (Signed)
[facility]  [address] [place],  [place]  [address] [phone]  Clinic Day:  06/01/2022  Referring physician: Gordan Payment., MD  ASSESSMENT & PLAN:   Assessment & Plan: Immune thrombocytopenic purpura (HCC) Slowly worsening thrombocytopenia felt to be most consistent with immune thrombocytopenic purpura. She was treated with pulse Decadron 12 mg daily for 5 days.  She had an increase in her platelets with pulse dexamethasone up to 69,000.  Unfortunately, her platelets have been steadily decreasing.  We will continue to monitor her and avoid further steroids if possible, due to the side effects of agitation and insomnia.      I do not feel she could sit still for IVIG infusions, and the same for rituximab.  She is too elderly and frail to consider splenectomy.  We will therefore continue to monitor her and see her on a weekly basis with CBC and CMP.with her platelets down to 23,000, I think we need to try another abbreviated course of dexamethasone.  She did respond the first time but had excessive agitation that required Haldol for control I will therefore recommend 4 mg of Decadron to take 3 daily for 3 days.  We can increase the Haldol to 1 to 2 mg every 4 hours as needed and 3 to 5 mg at bedtime as needed.  The patient's husband understands the plans discussed today and is in agreement with them.  They know to contact our office if she develops concerns prior to her next appointment.     I provided 20 minutes of face-to-face time during this encounter and > 50% was spent counseling as documented under my assessment and plan.    Dellia Beckwith, MD  [facility] AT [facility] [address] [place] [address] Dept: [phone] Dept Fax: [phone]   No orders of the defined types were placed in this encounter.     CHIEF COMPLAINT:  CC: Immune thrombocytopenic purpura  Current  Treatment: Observation  HISTORY OF PRESENT ILLNESS:  Maria Holloway is a 86 y.o. female with dementia who has worsening thrombocytopenia over the past month.  She is referred by Dr. Feliciana Rossetti for assessment and management.  We began seeing her on May 12th, at which time her platelet count was 31,000.  She has a history of CVA in February of this year, for which she was on aspirin 81 mg daily.  On April 6th, she presented to [facility] ER after a fall with significant bruising of her face.  She had mild thrombocytopenia with a platelet count of 119,000.  White blood count and hemoglobin were normal.  CT head did not reveal any intracranial injury.  There was a forehead hematoma without calvarial fracture. Chronic small vessel disease with infarcts was seen.  EKG revealed ST elevation consistent with an acute myocardial infarction.  She was transferred to [facility] and underwent cardiac catheterization with stenting of the right coronary artery.  Her folate level was mildly low at 5.6.  She was discharged home on aspirin 81 mg daily and Brilinta 90 mg twice daily.  She was seen at the [facility] ER again on April 12th with general weakness and her platelet count remained low at 117,000.  Her hemoglobin was mildly low at 11.7 and bilirubin was 2.  She presented to the ER again on April 15th with restlessness and anxiety, which she had been experiencing since her hospitalization for MRI.  She had persistent thrombocytopenia with a platelet count of 117,000, but normal white count and hemoglobin.  The bilirubin was 1.5.  No other specific concerns were found.  She presented to the [facility] ER again on April 28th with general weakness and dizziness. Her CT head did not reveal any acute abnormality.  CTA chest did not reveal acute pulmonary embolism.  There were multiple pulmonary nodules the largest measuring 2.8 cm.  Repeat CT or PET scan was recommended.  Small bilateral pleural effusions with  mild interstitial edema was seen.  She also had elevation of her BNP.  She was admitted and diuresed.  Echocardiogram revealed mild concentric left ventricular hypertrophy with overall normal left ventricular function with an ejection fraction of 55 to 60%.  She had persistent thrombocytopenia with a platelet count of 66,000 to 79,00 during her 3-day admission.  Her hemoglobin fluctuated up and down but remained above 11.  White blood count remained normal and bilirubin was normal.  She saw Dr. Shary Decamp for follow-up on May 8th at which time her platelet count had dropped to 29,000, so she was referred here.  White blood cells, hemoglobin and bilirubin remained normal.  Dr. Allyson Sabal switched from [facility] to Plavix due to thrombocytopenia, which she was to start on May 13th.   Evaluation in our office revealed persistent mild folate deficiency with folic acid of 5.2.  B12 was normal.  There was no evidence of hemolysis.  Serum protein electrophoresis did not reveal a monoclonal protein.  We felt this was most consistent with immune thrombocytopenic purpura.  She was treated with pulse dexamethasone 12 mg daily for 5 days, which is a reduced dose, as we were concerned higher doses or treatment for an extended period of time would worsen her dementia and agitation.  She was also given haloperidol 1 mg every 8 hours to use as needed for agitation.  She tolerated dexamethasone better than expected, but did require Haldol fairly regularly for agitation and insomnia.  Initially, her platelet count went up to 69,000, but since that time, it had steadily decreased.  She also has borderline anemia.  She has been steadily losing weight.  INTERVAL HISTORY:  Maria Holloway is here today for repeat clinical assessment.  She denies abnormal bleeding.  She denies further falls, but remains unsteady on her feet.  She continues to use a cane.  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  Her platelet count  is now down to 23,000, and so I feel we are forced to treat her with steroids once again.  We will try her on a short course of dexamethasone 4 mg to take 3 daily for 3 days.  I told her husband he could increase the Haldol to 1 to 2 mg every 4 hours as needed and 3 to 5 mg at bedtime as needed.  We will continue to monitor her on a weekly basis. She denies fevers or chills. She denies pain. Her appetite is decreased and her husband is working to try to get her to eat more, as well as use nutritional supplements.  She has gained 1 pound over the last week.  She continues folic acid daily.  REVIEW OF SYSTEMS:  Review of Systems  Constitutional:  Negative for appetite change, chills, fatigue, fever and unexpected weight change.  HENT:   Negative for lump/mass, mouth sores and sore throat.   Respiratory:  Negative for cough and shortness of breath.   Cardiovascular:  Negative for chest pain and leg swelling.  Gastrointestinal:  Negative for abdominal pain, constipation, diarrhea, nausea and vomiting.  Endocrine: Negative for hot flashes.  Genitourinary:  Negative for difficulty urinating, dysuria, frequency and hematuria.   Musculoskeletal:  Positive for gait problem (Unsteady, walks with a cane). Negative for arthralgias, back pain and myalgias.  Skin:  Negative for rash.  Neurological:  Positive for gait problem (Unsteady, walks with a cane). Negative for dizziness and headaches.  Hematological:  Negative for adenopathy. Does not bruise/bleed easily.  Psychiatric/Behavioral:  Positive for depression (Due to memory difficulties). Negative for sleep disturbance. The patient is not nervous/anxious.      VITALS:  There were no vitals taken for this visit.  Wt Readings from Last 3 Encounters:  05/25/22 124 lb 9.6 oz (56.5 kg)  05/18/22 124 lb 12.8 oz (56.6 kg)  05/11/22 125 lb 8 oz (56.9 kg)    There is no height or weight on file to calculate BMI.  Performance status (ECOG): 2 - Symptomatic,  <50% confined to bed  PHYSICAL EXAM:  Physical Exam Vitals and nursing note reviewed.  Constitutional:      General: She is not in acute distress.    Appearance: Normal appearance.  HENT:     Head: Normocephalic and atraumatic.     Mouth/Throat:     Mouth: Mucous membranes are moist.     Pharynx: Oropharynx is clear. No oropharyngeal exudate or posterior oropharyngeal erythema.  Eyes:     General: No scleral icterus.    Extraocular Movements: Extraocular movements intact.     Conjunctiva/sclera: Conjunctivae normal.     Pupils: Pupils are equal, round, and reactive to light.  Cardiovascular:     Rate and Rhythm: Normal rate and regular rhythm.     Heart sounds: Normal heart sounds. No murmur heard.    No friction rub. No gallop.  Pulmonary:     Effort: Pulmonary effort is normal.     Breath sounds: Normal breath sounds. No wheezing, rhonchi or rales.  Abdominal:     General: There is no distension.     Palpations: Abdomen is soft. There is no hepatomegaly, splenomegaly or mass.     Tenderness: There is no abdominal tenderness.  Musculoskeletal:        General: Normal range of motion.     Cervical back: Normal range of motion and neck supple. No tenderness.     Right lower leg: No edema.     Left lower leg: No edema.  Lymphadenopathy:     Cervical: No cervical adenopathy.     Upper Body:     Right upper body: No supraclavicular or axillary adenopathy.     Left upper body: No supraclavicular or axillary adenopathy.     Lower Body: No right inguinal adenopathy. No left inguinal adenopathy.  Skin:    General: Skin is warm and dry.     Coloration: Skin is not jaundiced.     Findings: Bruising (Multiple, in various stages of healing) present. No rash.  Neurological:     Mental Status: She is alert and oriented to person, place, and time.     Cranial Nerves: No cranial nerve deficit.  Psychiatric:        Mood and Affect: Mood normal.        Behavior: Behavior normal.         Thought Content: Thought content normal.   LABS:      Latest Ref Rng & Units 05/25/2022  12:00 AM 05/18/2022   12:00 AM 05/11/2022   12:00 AM  CBC  WBC  5.5     3.8     5.1      Hemoglobin 12.0 - 16.0 12.1     11.7     11.9      Hematocrit 36 - 46 38     37     37      Platelets 150 - 400 K/uL 32     36     31         This result is from an external source.       Latest Ref Rng & Units 05/25/2022   12:00 AM 05/11/2022   12:00 AM 05/04/2022   12:00 AM  CMP  BUN 4 - 21 8     10     9       Creatinine 0.5 - 1.1 0.8     0.8     0.7      Sodium 137 - 147 134     136     134      Potassium 3.5 - 5.1 mEq/L 4.2     3.7     3.8      Chloride 99 - 108 102     101     99      CO2 13 - 22 23     26     29       Calcium 8.7 - 10.7 8.6     8.4     8.4      Alkaline Phos 25 - 125 65     75     76      AST 13 - 35 26     31     31       ALT 7 - 35 U/L 17     22     23          This result is from an external source.      No results found for: "CEA1", "CEA" / No results found for: "CEA1", "CEA" No results found for: "PSA1" No results found for: " " No results found for: "CAN125"  Lab Results  Component Value Date   TOTALPROTELP 6.0 04/14/2022   ALBUMINELP 3.5 04/14/2022   A1GS 0.3 04/14/2022   A2GS 0.5 04/14/2022   BETS 0.7 04/14/2022   GAMS 1.0 04/14/2022   MSPIKE Not Observed 04/14/2022   SPEI Comment 04/14/2022   No results found for: "TIBC", "FERRITIN", "IRONPCTSAT" Lab Results  Component Value Date   LDH 189 04/14/2022    STUDIES:  No results found.    HISTORY:   Past Medical History:  Diagnosis Date   Acute ST elevation myocardial infarction (STEMI) of inferior wall (HCC) 03/09/2022   Age-related osteoporosis without current pathological fracture 04/20/2021   Formatting of this note might be different from the original. Dexa 04/2021. At [facility].   Anxiety disorder 03/23/2021   Formatting of this note might be different from the original. Tried and failed escitalopram    At high risk for falls 01/23/2022   CAD (coronary artery disease)    a. s/p STEMI in 03/2022 with DES to prox/mid RCA.   Cerebral atherosclerosis    Delirium superimposed on dementia 03/10/2022   Essential hypertension    Fall 03/09/2022   Folate deficiency 04/27/2022   GERD without esophagitis 08/18/2021   History of arterial ischemic stroke 01/23/2022   Formatting of this note  might be different from the original. 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/2021   Nocturia 09/26/2021   Osteoporosis    Primary osteoarthritis involving multiple joints 08/18/2021   Seborrheic keratosis 09/26/2021   Stroke (HCC)    Thrombocytopenia (HCC)    Vitamin B12 deficiency     Past Surgical History:  Procedure Laterality Date   CORONARY/GRAFT ACUTE MI REVASCULARIZATION N/A 03/09/2022   Procedure: Coronary/Graft Acute MI Revascularization;  Surgeon: Runell Gess, MD;  Location: [facility] INVASIVE CV LAB;  Service: Cardiovascular;  Laterality: N/A;   LEFT HEART CATH AND CORONARY ANGIOGRAPHY N/A 03/09/2022   Procedure: LEFT HEART CATH AND CORONARY ANGIOGRAPHY;  Surgeon: Runell Gess, MD;  Location: [facility] INVASIVE CV LAB;  Service: Cardiovascular;  Laterality: N/A;    Family History  Problem Relation Age of Onset   Stroke Mother    CAD Father    Heart attack Father     Social History:  reports that she has never smoked. She has never used smokeless tobacco. She reports current alcohol use. She reports that she does not use drugs.The patient is accompanied by her husband today.  Allergies: No Known Allergies  Current Medications: Current Outpatient Medications  Medication Sig Dispense Refill   aspirin EC 81 MG tablet Take 81 mg by mouth daily. Swallow whole.     atorvastatin (LIPITOR) 80 MG tablet Take 80 mg by mouth at bedtime.     carvedilol (COREG) 6.25 MG tablet Take 6.25 mg  by mouth 2 (two) times daily.     clopidogrel (PLAVIX) 75 MG tablet Take 75 mg by mouth daily.     cyanocobalamin 1000 MCG tablet Take 1,000 mcg by mouth daily.     donepezil (ARICEPT) 5 MG tablet Take 5 mg by mouth daily.     Folic Acid (FOLATE PO)      furosemide (LASIX) 20 MG tablet Take 20 mg by mouth as needed for fluid or edema.     haloperidol (HALDOL) 1 MG tablet Take 1 tablet (1 mg total) by mouth every 8 (eight) hours as needed for agitation. 60 tablet 1   losartan (COZAAR) 25 MG tablet Take 1 tablet by mouth daily.     metoprolol tartrate (LOPRESSOR) 25 MG tablet Take 1 tablet (25 mg total) by mouth 2 (two) times daily. 180 tablet 1   nitroGLYCERIN (NITROSTAT) 0.4 MG SL tablet Place 1 tablet (0.4 mg total) under the tongue every 5 (five) minutes x 3 doses as needed for chest pain. 25 tablet 3   pantoprazole (PROTONIX) 40 MG tablet Take 1 tablet (40 mg total) by mouth daily. 90 tablet 1   sertraline (ZOLOFT) 50 MG tablet Take 50 mg by mouth daily.     No current facility-administered medications for this visit.

## 2022-06-08 ENCOUNTER — Other Ambulatory Visit: Payer: Medicare Other

## 2022-06-08 ENCOUNTER — Ambulatory Visit: Payer: Medicare Other | Admitting: Hematology and Oncology

## 2022-06-12 DIAGNOSIS — D693 Immune thrombocytopenic purpura: Secondary | ICD-10-CM | POA: Diagnosis not present

## 2022-06-13 ENCOUNTER — Other Ambulatory Visit: Payer: Self-pay | Admitting: Hematology and Oncology

## 2022-06-13 ENCOUNTER — Encounter: Payer: Self-pay | Admitting: Oncology

## 2022-06-13 ENCOUNTER — Inpatient Hospital Stay: Payer: Medicare Other | Attending: Hematology and Oncology

## 2022-06-13 ENCOUNTER — Encounter: Payer: Self-pay | Admitting: Cardiology

## 2022-06-13 DIAGNOSIS — E785 Hyperlipidemia, unspecified: Secondary | ICD-10-CM | POA: Diagnosis not present

## 2022-06-13 DIAGNOSIS — D693 Immune thrombocytopenic purpura: Secondary | ICD-10-CM | POA: Diagnosis present

## 2022-06-13 DIAGNOSIS — Z7952 Long term (current) use of systemic steroids: Secondary | ICD-10-CM | POA: Diagnosis not present

## 2022-06-13 DIAGNOSIS — I2510 Atherosclerotic heart disease of native coronary artery without angina pectoris: Secondary | ICD-10-CM | POA: Diagnosis not present

## 2022-06-13 DIAGNOSIS — K219 Gastro-esophageal reflux disease without esophagitis: Secondary | ICD-10-CM | POA: Insufficient documentation

## 2022-06-13 DIAGNOSIS — E538 Deficiency of other specified B group vitamins: Secondary | ICD-10-CM | POA: Diagnosis not present

## 2022-06-13 DIAGNOSIS — Z8673 Personal history of transient ischemic attack (TIA), and cerebral infarction without residual deficits: Secondary | ICD-10-CM | POA: Diagnosis not present

## 2022-06-13 DIAGNOSIS — Z79899 Other long term (current) drug therapy: Secondary | ICD-10-CM | POA: Diagnosis not present

## 2022-06-13 DIAGNOSIS — M1990 Unspecified osteoarthritis, unspecified site: Secondary | ICD-10-CM | POA: Diagnosis not present

## 2022-06-13 DIAGNOSIS — F0390 Unspecified dementia without behavioral disturbance: Secondary | ICD-10-CM | POA: Insufficient documentation

## 2022-06-13 DIAGNOSIS — M810 Age-related osteoporosis without current pathological fracture: Secondary | ICD-10-CM | POA: Diagnosis not present

## 2022-06-13 DIAGNOSIS — I252 Old myocardial infarction: Secondary | ICD-10-CM | POA: Insufficient documentation

## 2022-06-13 DIAGNOSIS — Z7902 Long term (current) use of antithrombotics/antiplatelets: Secondary | ICD-10-CM | POA: Diagnosis not present

## 2022-06-13 LAB — CBC AND DIFFERENTIAL
HCT: 39 (ref 36–46)
Hemoglobin: 12.7 (ref 12.0–16.0)
Neutrophils Absolute: 6.83
Platelets: 17 10*3/uL — AB (ref 150–400)
WBC: 7.5

## 2022-06-13 LAB — ABO/RH: ABO/RH(D): AB POS

## 2022-06-13 LAB — CBC: RBC: 4.49 (ref 3.87–5.11)

## 2022-06-14 ENCOUNTER — Inpatient Hospital Stay: Payer: Medicare Other

## 2022-06-14 DIAGNOSIS — D693 Immune thrombocytopenic purpura: Secondary | ICD-10-CM

## 2022-06-14 LAB — ABO/RH: ABO/RH(D): AB POS

## 2022-06-14 MED ORDER — SODIUM CHLORIDE 0.9% IV SOLUTION
250.0000 mL | Freq: Once | INTRAVENOUS | Status: AC
  Administered 2022-06-14: 250 mL via INTRAVENOUS

## 2022-06-14 MED ORDER — ACETAMINOPHEN 325 MG PO TABS
650.0000 mg | ORAL_TABLET | Freq: Once | ORAL | Status: AC
  Administered 2022-06-14: 650 mg via ORAL
  Filled 2022-06-14: qty 2

## 2022-06-14 MED ORDER — DIPHENHYDRAMINE HCL 25 MG PO CAPS
25.0000 mg | ORAL_CAPSULE | Freq: Once | ORAL | Status: AC
  Administered 2022-06-14: 25 mg via ORAL
  Filled 2022-06-14: qty 1

## 2022-06-14 NOTE — Patient Instructions (Signed)
Platelet Transfusion A platelet transfusion is a procedure in which a person receives donated platelets through an IV. Platelets are parts of blood that stick together and form a clot to help the body stop bleeding after an injury. If you have too few platelets, your blood may have trouble clotting. This may cause you to bleed and bruise very easily. You may need a platelet transfusion if you have a condition that causes a low number of platelets (thrombocytopenia). A platelet transfusion may be used to stop or prevent excessive bleeding. Tell a health care provider about: Any reactions you have had during previous transfusions. Any allergies you have. All medicines you are taking, including vitamins, herbs, eye drops, creams, and over-the-counter medicines. Any bleeding problems you have. Any surgeries you have had. Any medical conditions you have. Whether you are pregnant or may be pregnant. What are the risks? Generally, this is a safe procedure. However, problems may occur, including: Fever. Infection. Allergic reaction to the donated (donor) platelets. Your body's disease-fighting system (immune system) attacking the donor platelets (hemolytic reaction). This is rare. A rare reaction that causes lung damage (transfusion-related acute lung injury). What happens before the procedure? Medicines Ask your health care provider about: Changing or stopping your regular medicines. This is especially important if you are taking diabetes medicines or blood thinners. Taking medicines such as aspirin and ibuprofen. These medicines can thin your blood. Do not take these medicines unless your health care provider tells you to take them. Taking over-the-counter medicines, vitamins, herbs, and supplements. General instructions You will have a blood test to determine your blood type. Your blood type determines what kind of platelets you will be given. Follow instructions from your health care provider  about eating or drinking restrictions. If you have had an allergic reaction to a transfusion in the past, you may be given medicine to help prevent a reaction. Your temperature, blood pressure, pulse, and breathing will be monitored. What happens during the procedure?  An IV will be inserted into one of your veins. For your safety, two health care providers will verify your identity along with the donor platelets about to be infused. A bag of donor platelets will be connected to your IV. The platelets will flow into your bloodstream. This usually takes 30-60 minutes. Your temperature, blood pressure, pulse, and breathing will be monitored during the transfusion. This helps detect early signs of any reaction. You will also be monitored for other symptoms that may indicate a reaction, including chills, hives, or itching. If you have signs of a reaction at any time, your transfusion will be stopped, and you may be given medicine to help manage the reaction. When your transfusion is complete, your IV will be removed. Pressure may be applied to the IV site for a few minutes to stop any bleeding. The IV site will be covered with a bandage (dressing). The procedure may vary among health care providers and hospitals. What can I expect after the procedure? Your blood pressure, temperature, pulse, and breathing will be monitored until you leave the hospital or clinic. You may have some bruising and soreness at your IV site. Follow these instructions at home: Medicines Take over-the-counter and prescription medicines only as told by your health care provider. Talk with your health care provider before you take any medicines that contain aspirin or NSAIDs, such as ibuprofen. These medicines increase your risk for dangerous bleeding. IV site care Check your IV site every day for signs of infection. Check for:   Redness, swelling, or pain. Fluid or blood. If fluid or blood drains from your IV site, use your  hands to press down firmly on a bandage covering the area for a minute or two. Doing this should stop the bleeding. Warmth. Pus or a bad smell. General instructions Change or remove your dressing as told by your health care provider. Return to your normal activities as told by your health care provider. Ask your health care provider what activities are safe for you. Do not take baths, swim, or use a hot tub until your health care provider approves. Ask your health care provider if you may take showers. Keep all follow-up visits. This is important. Contact a health care provider if: You have a headache that does not go away with medicine. You have hives, rash, or itchy skin. You have nausea or vomiting. You feel unusually tired or weak. You have signs of infection at your IV site. Get help right away if: You have a fever or chills. You urinate less often than usual. Your urine is darker colored than normal. You have any of the following: Trouble breathing. Pain in your back, abdomen, or chest. Cool, clammy skin. A fast heartbeat. Summary Platelets are tiny pieces of blood cells that clump together to form a blood clot when you have an injury. If you have too few platelets, your blood may have trouble clotting. A platelet transfusion is a procedure in which you receive donated platelets through an IV. A platelet transfusion may be used to stop or prevent excessive bleeding. After the procedure, check your IV site every day for signs of infection. This information is not intended to replace advice given to you by your health care provider. Make sure you discuss any questions you have with your health care provider. Document Revised: 05/26/2021 Document Reviewed: 05/26/2021 Elsevier Patient Education  2023 [employer]  

## 2022-06-15 ENCOUNTER — Other Ambulatory Visit: Payer: Self-pay | Admitting: Hematology and Oncology

## 2022-06-15 ENCOUNTER — Inpatient Hospital Stay: Payer: Medicare Other | Admitting: Hematology and Oncology

## 2022-06-15 ENCOUNTER — Inpatient Hospital Stay: Payer: Medicare Other

## 2022-06-15 ENCOUNTER — Encounter: Payer: Self-pay | Admitting: Hematology and Oncology

## 2022-06-15 DIAGNOSIS — D693 Immune thrombocytopenic purpura: Secondary | ICD-10-CM

## 2022-06-15 LAB — BASIC METABOLIC PANEL
BUN: 17 (ref 4–21)
CO2: 31 — AB (ref 13–22)
Chloride: 101 (ref 99–108)
Creatinine: 0.8 (ref 0.5–1.1)
Glucose: 98
Potassium: 3.6 mEq/L (ref 3.5–5.1)
Sodium: 137 (ref 137–147)

## 2022-06-15 LAB — BPAM PLATELET PHERESIS
Blood Product Expiration Date: 202307142359
ISSUE DATE / TIME: 202307120737
Unit Type and Rh: 6200

## 2022-06-15 LAB — CBC AND DIFFERENTIAL
HCT: 41 (ref 36–46)
Hemoglobin: 13.1 (ref 12.0–16.0)
Neutrophils Absolute: 6.46
Platelets: 55 10*3/uL — AB (ref 150–400)
WBC: 8.5

## 2022-06-15 LAB — HEPATIC FUNCTION PANEL
ALT: 25 U/L (ref 7–35)
AST: 28 (ref 13–35)
Alkaline Phosphatase: 52 (ref 25–125)
Bilirubin, Total: 0.8

## 2022-06-15 LAB — COMPREHENSIVE METABOLIC PANEL
Albumin: 3.8 (ref 3.5–5.0)
Calcium: 9.1 (ref 8.7–10.7)

## 2022-06-15 LAB — PREPARE PLATELET PHERESIS: Unit division: 0

## 2022-06-15 LAB — CBC: RBC: 4.71 (ref 3.87–5.11)

## 2022-06-15 NOTE — Progress Notes (Addendum)
[facility]  [address] [place],  [place]  [address] [phone]   Addendum: We advised her husband to resume aspirin 81 g daily, but continue to hold Plavix.  Clinic Day:  06/15/2022  Referring physician: Gordan Payment., MD  ASSESSMENT & PLAN:   Assessment & Plan: Immune thrombocytopenic purpura (HCC) Tthrombocytopenia, felt to be most consistent with immune thrombocytopenic purpura.  We have treated her with pulsed dexamethasone due to concern over tolerance of high-dose steroids for any length of time due to dementia and agitation.  She had trouble tolerating decreased dose for 5 days, so we reduced this to 3 days when she was treated on June 29.  Unfortunately, she did not have an immediate response and was hospitalized with worsening thrombocytopenia, in addition to hypotension and dehydration.  She received oral prednisone 20 mg on Monday prior to discharge.  She then was given dexamethasone 20 mg on Tuesday.  She was transfused 1 unit of platelets yesterday.  Her platelets are 55,000 after platelet transfusion yesterday.  Unfortunately she has had a decline in her general health and continues to lose weight.  We feel this is due to her dementia.  When she was hospitalized she had a hospice consult and we encouraged her husband to proceed with that.  I contacted [place] [facility] and they stated we would still be able to consider platelet transfusion if necessary.  We will plan to see her back in 1 week for repeat clinical assessment.   The patient understands the plans discussed today and is in agreement with them.  She knows to contact our office if she develops concerns prior to her next appointment.   I provided 20 minutes of face-to-face time during this encounter and > 50% was spent counseling as documented under my assessment and plan.    Adah Perl, PA-C  [facility] [address] [place] [address] Dept: [phone] Dept Fax: [phone]   Orders Placed This Encounter  Procedures   CBC and differential    This external order was created through the Results Console.   CBC    This external order was created through the Results Console.   Basic metabolic panel    This external order was created through the Results Console.   Comprehensive metabolic panel    This external order was created through the Results Console.   Hepatic function panel    This external order was created through the Results Console.      CHIEF COMPLAINT:  CC: ITP  Current Treatment: Pulsed dexamethasone as needed  HISTORY OF PRESENT ILLNESS:  Maria Holloway is a 86 y.o. female with dementia with immune thrombocytopenic purpura.  We began seeing her on May 12th, at which time her platelet count was 31,000.  She has a history of CVA in February, for which she was on aspirin 81 mg daily.  On April 6th, she presented to [facility] ER after a fall with significant bruising of her face.  She had mild thrombocytopenia with a platelet count of 119,000.  White blood count and hemoglobin were normal.  CT head did not reveal any intracranial injury.  There was a forehead hematoma without calvarial fracture. Chronic small vessel disease with infarcts was seen.  EKG revealed ST elevation consistent with an acute myocardial infarction.  She was transferred to [facility] and underwent cardiac catheterization with stenting of the right coronary artery.  Her folate level was mildly low at 5.6.  She was discharged home on aspirin 81 mg daily and Brilinta 90 mg twice daily.  She was seen at the [facility] again on April 12th with general weakness and her platelet count remained low at 117,000.  Her hemoglobin was mildly low at 11.7 and bilirubin was 2.  She presented to the ER again on April 15th with restlessness and anxiety, which she had been experiencing since her  hospitalization for MRI.  She had persistent thrombocytopenia with a platelet count of 117,000, but normal white count and hemoglobin. The bilirubin was 1.5.  No other specific concerns were found.  She presented to the [facility] again on April 28th with general weakness and dizziness. Her CT head did not reveal any acute abnormality.  CTA chest did not reveal acute pulmonary embolism.  There were multiple pulmonary nodules the largest measuring 2.8 cm.  Repeat CT or PET scan was recommended.  Small bilateral pleural effusions with mild interstitial edema was seen.  She also had elevation of her BNP.  She was admitted and diuresed.  Echocardiogram revealed mild concentric left ventricular hypertrophy with overall normal left ventricular function with an ejection fraction of 55 to 60%.  She had persistent thrombocytopenia with a platelet count of 66,000 to 79,00 during her 3-day admission.  Her hemoglobin fluctuated up and down but remained above 11.  White blood count remained normal and bilirubin was normal.  She saw Dr. Bea Graff for follow-up on May 8th at which time her platelet count had dropped to 29,000, so she was referred here.  White blood cells, hemoglobin and bilirubin remained normal.  Dr. Gwenlyn Found switched from [facility] to Plavix due to thrombocytopenia on May 13th.   Evaluation in our office revealed persistent mild folate deficiency with folic acid of 5.2.  B12 was normal.  There was no evidence of hemolysis.  Serum protein electrophoresis did not reveal a monoclonal protein.  We felt this was most consistent with immune thrombocytopenic purpura.  She was treated with pulse dexamethasone 12 mg daily for 5 days, which is a reduced dose, as we were concerned higher doses or treatment for an extended period of time would worsen her dementia and agitation.  She was also given haloperidol 1 mg every 8 hours to use as needed for agitation.  She tolerated dexamethasone better than expected, but did  require Haldol fairly regularly.  Initially, her platelet count went up to 69,000, but since that time, it had steadily decreased.  As her platelet count remained above 20,000, we opted for observation due to difficulty tolerating the dexamethasone.  She also had borderline anemia.  She has been steadily losing weight.   Her platelets dropped down to 23,000 on June 29, so she was given another pulse dexamethasone at a reduced dose of 4 mg 3 times daily for 3 days due to previous poor tolerance.  Her husband was unable to bring her for her appointment the following week.  She presented to the emergency room on July 10 with near syncope, hypotension and dehydration.  Her platelet count was 14,000 despite the pulse of dexamethasone.  She was admitted for further management of her hypotension and dehydration.  She received 1 platelet pheresis and started on dexamethasone 20 mg daily.  She and her husband insisted on discharge later that day, so she was sent out on oral dexamethasone.  We arranged for her to have a platelet transfusion as an outpatient on  July 12.  INTERVAL HISTORY:  Maria Holloway is here today for repeat clinical assessment.  She has a very flat affect and is fairly noncommunicative today.  She reports fatigue and decreased appetite.  This is a big change since last time I saw the patient, but Dr. Gilman Buttner states she was declining when she saw her in the hospital.  Her husband accompanies her and gives the history.  He states he needs to contact hospice again if he would like them to start hospice services.  He states she has not had any bleeding or significant bruising.  Her Plavix and aspirin have been on hold since her platelets were less than 20,000.  He asks about resuming these.  He states that he has to force her to eat she is eating about 50% of normal.  She is drinking Ensure 1-2 daily.  Fortunately, she has not had further falls.  Her weight has decreased 5 pounds over last 2 weeks .  REVIEW  OF SYSTEMS:  Review of Systems  Constitutional:  Negative for appetite change, chills, fatigue, fever and unexpected weight change.  HENT:   Negative for lump/mass, mouth sores and sore throat.   Respiratory:  Negative for cough and shortness of breath.   Cardiovascular:  Negative for chest pain and leg swelling.  Gastrointestinal:  Negative for abdominal pain, constipation, diarrhea, nausea and vomiting.  Endocrine: Negative for hot flashes.  Genitourinary:  Negative for difficulty urinating, dysuria, frequency and hematuria.   Musculoskeletal:  Negative for arthralgias, back pain and myalgias.  Skin:  Negative for rash.  Neurological:  Negative for dizziness and headaches.  Hematological:  Negative for adenopathy. Does not bruise/bleed easily.  Psychiatric/Behavioral:  Negative for depression and sleep disturbance. The patient is not nervous/anxious.      VITALS:  Blood pressure 139/72, pulse 69, temperature (!) 97.5 F (36.4 C), temperature source Oral, resp. rate 18, height 5' 4.3" (1.633 m), weight 119 lb 3.2 oz (54.1 kg), SpO2 96 %.  Wt Readings from Last 3 Encounters:  06/15/22 119 lb 3.2 oz (54.1 kg)  06/14/22 121 lb 1.3 oz (54.9 kg)  06/01/22 124 lb 14.4 oz (56.7 kg)    Body mass index is 20.27 kg/m.  Performance status (ECOG): 2 - Symptomatic, <50% confined to bed  PHYSICAL EXAM:  Physical Exam Vitals and nursing note reviewed.  Constitutional:      General: She is not in acute distress.    Appearance: Normal appearance.  HENT:     Head: Normocephalic and atraumatic.     Mouth/Throat:     Mouth: Mucous membranes are moist.     Pharynx: Oropharynx is clear. No oropharyngeal exudate or posterior oropharyngeal erythema.  Eyes:     General: No scleral icterus.    Extraocular Movements: Extraocular movements intact.     Conjunctiva/sclera: Conjunctivae normal.     Pupils: Pupils are equal, round, and reactive to light.  Cardiovascular:     Rate and Rhythm: Normal  rate and regular rhythm.     Heart sounds: Normal heart sounds. No murmur heard.    No friction rub. No gallop.  Pulmonary:     Effort: Pulmonary effort is normal.     Breath sounds: Normal breath sounds. No wheezing, rhonchi or rales.  Abdominal:     General: There is no distension.     Palpations: Abdomen is soft. There is no hepatomegaly, splenomegaly or mass.     Tenderness: There is no abdominal tenderness.  Musculoskeletal:  General: Normal range of motion.     Cervical back: Normal range of motion and neck supple. No tenderness.     Right lower leg: No edema.     Left lower leg: No edema.  Lymphadenopathy:     Cervical: No cervical adenopathy.     Upper Body:     Right upper body: No supraclavicular or axillary adenopathy.     Left upper body: No supraclavicular or axillary adenopathy.     Lower Body: No right inguinal adenopathy. No left inguinal adenopathy.  Skin:    General: Skin is warm and dry.     Coloration: Skin is not jaundiced.     Findings: No rash.  Neurological:     Mental Status: She is alert and oriented to person, place, and time.     Cranial Nerves: No cranial nerve deficit.  Psychiatric:        Mood and Affect: Mood normal.        Behavior: Behavior normal.        Thought Content: Thought content normal.     LABS:      Latest Ref Rng & Units 06/15/2022   12:00 AM 06/13/2022   12:00 AM 06/01/2022   12:00 AM  CBC  WBC  8.5     7.5     4.7      Hemoglobin 12.0 - 16.0 13.1     12.7     11.7      Hematocrit 36 - 46 41     39     36      Platelets 150 - 400 K/uL 55     17     23         This result is from an external source.      Latest Ref Rng & Units 06/15/2022   12:00 AM 06/01/2022   12:00 AM 05/25/2022   12:00 AM  CMP  BUN 4 - 21 17     9     8       Creatinine 0.5 - 1.1 0.8     0.7     0.8      Sodium 137 - 147 137     132     134      Potassium 3.5 - 5.1 mEq/L 3.6     4.5     4.2      Chloride 99 - 108 101     100     102      CO2  13 - 22 31     24     23       Calcium 8.7 - 10.7 9.1     9.1     8.6      Alkaline Phos 25 - 125 52     56     65      AST 13 - 35 28     27     26       ALT 7 - 35 U/L 25     15     17          This result is from an external source.     No results found for: "CEA1", "CEA" / No results found for: "CEA1", "CEA" No results found for: "PSA1" No results found for: "[id number]" No results found for: "CAN125"  Lab Results  Component Value Date   TOTALPROTELP 6.0 04/14/2022   ALBUMINELP 3.5 04/14/2022   A1GS 0.3  04/14/2022   A2GS 0.5 04/14/2022   BETS 0.7 04/14/2022   GAMS 1.0 04/14/2022   MSPIKE Not Observed 04/14/2022   SPEI Comment 04/14/2022   No results found for: "TIBC", "FERRITIN", "IRONPCTSAT" Lab Results  Component Value Date   LDH 189 04/14/2022    STUDIES:  No results found.    HISTORY:   Past Medical History:  Diagnosis Date   Acute ST elevation myocardial infarction (STEMI) of inferior wall ([place]) 03/09/2022   Age-related osteoporosis without current pathological fracture 04/20/2021   Formatting of this note might be different from the original. Dexa 04/2021. [facility].   Anxiety disorder 03/23/2021   Formatting of this note might be different from the original. Tried and failed escitalopram   At high risk for falls 01/23/2022   CAD (coronary artery disease)    a. s/p STEMI in 03/2022 with DES to prox/mid RCA.   Cerebral atherosclerosis    Delirium superimposed on dementia 03/10/2022   Essential hypertension    Fall 03/09/2022   Folate deficiency 04/27/2022   GERD without esophagitis 08/18/2021   History of arterial ischemic stroke 01/23/2022   Formatting of this note might be different from the original. 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/2021   Nocturia 09/26/2021   Osteoporosis    Primary osteoarthritis involving multiple joints 08/18/2021    Seborrheic keratosis 09/26/2021   Stroke ([place])    Thrombocytopenia ([place])    Vitamin B12 deficiency     Past Surgical History:  Procedure Laterality Date   CORONARY/GRAFT ACUTE MI REVASCULARIZATION N/A 03/09/2022   Procedure: Coronary/Graft Acute MI Revascularization;  Surgeon: Lorretta Harp, MD;  Location: [facility];  Service: Cardiovascular;  Laterality: N/A;   LEFT HEART CATH AND CORONARY ANGIOGRAPHY N/A 03/09/2022   Procedure: LEFT HEART CATH AND CORONARY ANGIOGRAPHY;  Surgeon: Lorretta Harp, MD;  Location: [facility];  Service: Cardiovascular;  Laterality: N/A;    Family History  Problem Relation Age of Onset   Stroke Mother    CAD Father    Heart attack Father     Social History:  reports that she has never smoked. She has never used smokeless tobacco. She reports current alcohol use. She reports that she does not use drugs.The patient is accompanied by her husband today.  Allergies: No Known Allergies  Current Medications: Current Outpatient Medications  Medication Sig Dispense Refill   alendronate (FOSAMAX) 70 MG tablet Take 70 mg by mouth once a week.     aspirin EC 81 MG tablet Take 81 mg by mouth daily. Swallow whole. (Patient not taking: Reported on 06/15/2022)     atorvastatin (LIPITOR) 80 MG tablet Take 80 mg by mouth at bedtime.     carvedilol (COREG) 6.25 MG tablet Take 6.25 mg by mouth 2 (two) times daily.     clopidogrel (PLAVIX) 75 MG tablet Take 75 mg by mouth daily. (Patient not taking: Reported on 06/15/2022)     cyanocobalamin 1000 MCG tablet Take 1,000 mcg by mouth daily.     dexamethasone (DECADRON) 4 MG tablet Take 1 tablet (4 mg total) by mouth 3 (three) times daily. 9 tablet 1   donepezil (ARICEPT) 5 MG tablet Take 5 mg by mouth daily.     Folic Acid (FOLATE PO)      furosemide (LASIX) 20 MG tablet Take 20 mg by mouth as  needed for fluid or edema.     haloperidol (HALDOL) 1 MG tablet TAKE 1 TABLET (1 MG TOTAL) BY MOUTH EVERY 8  (EIGHT) HOURS AS NEEDED FOR AGITATION. 60 tablet 1   losartan (COZAAR) 25 MG tablet Take 1 tablet by mouth daily.     metoprolol tartrate (LOPRESSOR) 25 MG tablet Take 1 tablet (25 mg total) by mouth 2 (two) times daily. 180 tablet 1   nitroGLYCERIN (NITROSTAT) 0.4 MG SL tablet Place 1 tablet (0.4 mg total) under the tongue every 5 (five) minutes x 3 doses as needed for chest pain. 25 tablet 3   pantoprazole (PROTONIX) 40 MG tablet Take 1 tablet (40 mg total) by mouth daily. 90 tablet 1   sertraline (ZOLOFT) 50 MG tablet Take 50 mg by mouth daily.     No current facility-administered medications for this visit.

## 2022-06-15 NOTE — Assessment & Plan Note (Addendum)
Tthrombocytopenia, felt to be most consistent with immune thrombocytopenic purpura.  We have treated her with pulsed dexamethasone due to concern over tolerance of high-dose steroids for any length of time due to dementia and agitation.  She had trouble tolerating decreased dose for 5 days, so we reduced this to 3 days when she was treated on June 29.  Unfortunately, she did not have an immediate response and was hospitalized with worsening thrombocytopenia, in addition to hypotension and dehydration.  She received oral prednisone 20 mg on Monday prior to discharge.  She then was given dexamethasone 20 mg on Tuesday.  She was transfused 1 unit of platelets yesterday.  Her platelets are 55,000 after platelet transfusion yesterday.  Unfortunately she has had a decline in her general health and continues to lose weight.  We feel this is due to her dementia.  When she was hospitalized she had a hospice consult and we encouraged her husband to proceed with that.  I contacted [place] [facility] and they stated we would still be able to consider platelet transfusion if necessary.  We will plan to see her back in 1 week for repeat clinical assessment.

## 2022-06-22 ENCOUNTER — Encounter: Payer: Self-pay | Admitting: Oncology

## 2022-06-22 ENCOUNTER — Inpatient Hospital Stay (INDEPENDENT_AMBULATORY_CARE_PROVIDER_SITE_OTHER): Payer: Medicare Other | Admitting: Oncology

## 2022-06-22 ENCOUNTER — Inpatient Hospital Stay: Payer: Medicare Other

## 2022-06-22 VITALS — BP 154/75 | HR 79 | Temp 98.4°F | Resp 18 | Ht 64.3 in | Wt 118.5 lb

## 2022-06-22 DIAGNOSIS — D693 Immune thrombocytopenic purpura: Secondary | ICD-10-CM | POA: Diagnosis not present

## 2022-06-22 LAB — CBC AND DIFFERENTIAL
HCT: 38 (ref 36–46)
Hemoglobin: 12.2 (ref 12.0–16.0)
Neutrophils Absolute: 3.8
Platelets: 11 10*3/uL — AB (ref 150–400)
WBC: 5.5

## 2022-06-22 LAB — BASIC METABOLIC PANEL
BUN: 13 (ref 4–21)
CO2: 26 — AB (ref 13–22)
Chloride: 104 (ref 99–108)
Creatinine: 0.7 (ref 0.5–1.1)
Glucose: 107
Potassium: 4.1 mEq/L (ref 3.5–5.1)
Sodium: 136 — AB (ref 137–147)

## 2022-06-22 LAB — HEPATIC FUNCTION PANEL
ALT: 18 U/L (ref 7–35)
AST: 21 (ref 13–35)
Alkaline Phosphatase: 57 (ref 25–125)
Bilirubin, Total: 0.8

## 2022-06-22 LAB — CBC: RBC: 4.37 (ref 3.87–5.11)

## 2022-06-22 LAB — COMPREHENSIVE METABOLIC PANEL
Albumin: 3.5 (ref 3.5–5.0)
Calcium: 8.9 (ref 8.7–10.7)

## 2022-06-22 NOTE — Progress Notes (Deleted)
[facility]  [address] 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 We will support with platelet transfusions. But she is not a candidate for splenectomy, rituxan infusions or IVIG. I have therfore discussed the possibility of Nplate injections as the most benign approach for her. We will discuss this further next week.   S]SEVERE WORSENING DEMENTIA She is no longer eating well and steadily losing weight. We discussed palliative care for this problem and she will be a candidate for hospice if this continues to progress.    Unfortunately, her platelets have been dramatically decreasing. We have set up[ a transfusion appointment for tomorrow 06/23/2022 at 1:30. We will continue to monitor weekly and try to avoid further steroids if possible, due to the side effects of agitation and insomnia.  I will plan to see her back in 1 week for repeat clinical assessment with CBC and CMP. We discussed possible weekly NPlate injections.  The patient understands the plans discussed today and is in agreement with them.  She knows to contact our office if she develops concerns prior to her next appointment.   I provided 20 minutes of face-to-face time during this encounter and > 50% was spent counseling as documented under my assessment and plan.    Dellia Beckwith, MD  [facility] [address] [place] [address] Dept: [phone] Dept Fax: [phone]   Orders Placed This Encounter  Procedures   CBC and differential    This external order was created through the Results Console.   CBC    This external order was created through the Results Console.   Basic metabolic panel     This external order was created through the Results Console.   Comprehensive metabolic panel    This external order was created through the Results Console.   Hepatic function panel    This external order was created through the Results Console.      CHIEF COMPLAINT:  CC: ITP  Current Treatment: Pulsed dexamethasone as needed  HISTORY OF PRESENT ILLNESS:  Maria Holloway is a 86 y.o. female with dementia with immune thrombocytopenic purpura.  We began seeing her on May 12th, at which time her platelet count was 31,000.  She has a history of CVA in February, for which she was on aspirin 81 mg daily.  On April 6th, she presented to [facility] ER after a fall with significant bruising of her face.  She had mild thrombocytopenia with a platelet count of 119,000.  White blood count and hemoglobin were normal.  CT head did not reveal any intracranial injury.  There was a forehead hematoma without calvarial fracture. Chronic small vessel disease with infarcts was seen.  EKG revealed ST elevation consistent with an acute myocardial infarction.  She was transferred to [facility] and underwent cardiac catheterization with stenting of the right coronary artery.  Her folate level was mildly low at 5.6.  She was discharged home on aspirin 81 mg daily and Brilinta 90 mg twice daily.  She was seen at the [facility] ER again on April 12th with general weakness and her platelet count remained low at 117,000.  Her hemoglobin was mildly low at 11.7  and bilirubin was 2.  She presented to the ER again on April 15th with restlessness and anxiety, which she had been experiencing since her hospitalization for MRI.  She had persistent thrombocytopenia with a platelet count of 117,000, but normal white count and hemoglobin. The bilirubin was 1.5.  No other specific concerns were found.  She presented to the [facility] ER again on April 28th with general weakness and dizziness. Her CT head did not reveal any  acute abnormality.  CTA chest did not reveal acute pulmonary embolism.  There were multiple pulmonary nodules the largest measuring 2.8 cm.  Repeat CT or PET scan was recommended.  Small bilateral pleural effusions with mild interstitial edema was seen.  She also had elevation of her BNP.  She was admitted and diuresed.  Echocardiogram revealed mild concentric left ventricular hypertrophy with overall normal left ventricular function with an ejection fraction of 55 to 60%.  She had persistent thrombocytopenia with a platelet count of 66,000 to 79,00 during her 3-day admission.  Her hemoglobin fluctuated up and down but remained above 11.  White blood count remained normal and bilirubin was normal.  She saw Dr. Shary Decamp for follow-up on May 8th at which time her platelet count had dropped to 29,000, so she was referred here.  White blood cells, hemoglobin and bilirubin remained normal.  Dr. Allyson Sabal switched from [facility] to Plavix due to thrombocytopenia on May 13th.   Evaluation in our office revealed persistent mild folate deficiency with folic acid of 5.2.  B12 was normal.  There was no evidence of hemolysis.  Serum protein electrophoresis did not reveal a monoclonal protein.  We felt this was most consistent with immune thrombocytopenic purpura.  She was treated with pulse dexamethasone 12 mg daily for 5 days, which is a reduced dose, as we were concerned higher doses or treatment for an extended period of time would worsen her dementia and agitation.  She was also given haloperidol 1 mg every 8 hours to use as needed for agitation.  She tolerated dexamethasone better than expected, but did require Haldol fairly regularly.  Initially, her platelet count went up to 69,000, but since that time, it had steadily decreased.  As her platelet count remained above 20,000, we opted for observation due to difficulty tolerating the dexamethasone.  She also had borderline anemia.  She has been steadily losing weight.   Her  platelets dropped down to 23,000 on June 29, so she was given another pulse dexamethasone at a reduced dose of 4 mg 3 times daily for 3 days due to previous poor tolerance.  Her husband was unable to bring her for her appointment the following week.  She presented to the emergency room on July 10 with near syncope, hypotension and dehydration.  Her platelet count was 14,000 despite the pulse of dexamethasone.  She was admitted for further management of her hypotension and dehydration.  She received 1 platelet pheresis and started on dexamethasone 20 mg daily.  She and her husband insisted on discharge later that day, so she was sent out on oral dexamethasone.  We arranged for her to have a platelet transfusion as an outpatient on July 12.  INTERVAL HISTORY:  Maria Holloway is here today for a routine follow up.  Her platelets are low at 11,000. I did recommend a transfusion and have an appointment ready for tomorrow at 1:30. She continues to use a cane.  She has depression due to her memory issues, for which she is on  sertraline.  She is not sitting still. Her appetite is decreased and her husband is working to try to get her to eat more, as well as use nutritional supplements. She continues folic acid daily. Her weight is down 1 pound since her last visit..  Wt Readings from Last 3 Encounters:  07/19/22 117 lb 11.2 oz (53.4 kg)  07/12/22 120 lb (54.4 kg)  07/12/22 120 lb 9.6 oz (54.7 kg)  We did discuss palliative care for treatment. We have discussed using Nplate once a week but will continue to research more on it and discuss further at their next appointment.She denies pain.She denies fevers or chills.      REVIEW OF SYSTEMS:  Review of Systems  Constitutional:  Negative for appetite change, chills, fatigue, fever and unexpected weight change.  HENT:   Negative for lump/mass, mouth sores and sore throat.   Respiratory:  Negative for cough and shortness of breath.   Cardiovascular:  Negative for chest  pain and leg swelling.  Gastrointestinal:  Negative for abdominal pain, constipation, diarrhea, nausea and vomiting.  Endocrine: Negative for hot flashes.  Genitourinary:  Negative for difficulty urinating, dysuria, frequency and hematuria.   Musculoskeletal:  Negative for arthralgias, back pain and myalgias.  Skin:  Negative for rash.  Neurological:  Negative for dizziness and headaches.  Hematological:  Negative for adenopathy. Does not bruise/bleed easily.  Psychiatric/Behavioral:  Negative for depression and sleep disturbance. The patient is not nervous/anxious.      VITALS:  Blood pressure (!) 154/75, pulse 79, temperature 98.4 F (36.9 C), temperature source Oral, resp. rate 18, height 5' 4.3" (1.633 m), weight 118 lb 8 oz (53.8 kg), SpO2 96 %.  Wt Readings from Last 3 Encounters:  07/19/22 117 lb 11.2 oz (53.4 kg)  07/12/22 120 lb (54.4 kg)  07/12/22 120 lb 9.6 oz (54.7 kg)    Body mass index is 20.15 kg/m.  Performance status (ECOG): 2 - Symptomatic, <50% confined to bed  PHYSICAL EXAM:  Physical Exam Vitals and nursing note reviewed.  Constitutional:      General: She is not in acute distress.    Appearance: Normal appearance.  HENT:     Head: Normocephalic and atraumatic.     Mouth/Throat:     Mouth: Mucous membranes are moist.     Pharynx: Oropharynx is clear. No oropharyngeal exudate or posterior oropharyngeal erythema.  Eyes:     General: No scleral icterus.    Extraocular Movements: Extraocular movements intact.     Conjunctiva/sclera: Conjunctivae normal.     Pupils: Pupils are equal, round, and reactive to light.  Cardiovascular:     Rate and Rhythm: Normal rate and regular rhythm.     Heart sounds: Normal heart sounds. No murmur heard.    No friction rub. No gallop.  Pulmonary:     Effort: Pulmonary effort is normal.     Breath sounds: Normal breath sounds. No wheezing, rhonchi or rales.  Abdominal:     General: There is no distension.     Palpations:  Abdomen is soft. There is no hepatomegaly, splenomegaly or mass.     Tenderness: There is no abdominal tenderness.  Musculoskeletal:        General: Normal range of motion.     Cervical back: Normal range of motion and neck supple. No tenderness.     Right lower leg: No edema.     Left lower leg: No edema.  Lymphadenopathy:     Cervical: No cervical adenopathy.  Upper Body:     Right upper body: No supraclavicular or axillary adenopathy.     Left upper body: No supraclavicular or axillary adenopathy.     Lower Body: No right inguinal adenopathy. No left inguinal adenopathy.  Skin:    General: Skin is warm and dry.     Coloration: Skin is not jaundiced.     Findings: No rash.  Neurological:     Mental Status: She is alert and oriented to person, place, and time.     Cranial Nerves: No cranial nerve deficit.  Psychiatric:        Mood and Affect: Mood normal.        Behavior: Behavior normal.        Thought Content: Thought content normal.     LABS:      Latest Ref Rng & Units 07/19/2022   12:00 AM 07/12/2022   12:00 AM 07/06/2022   12:00 AM  CBC  WBC  5.3     4.7     4.7      Hemoglobin 12.0 - 16.0 11.4     10.3     9.1      Hematocrit 36 - 46 35     31     28      Platelets 150 - 400 K/uL 129     54     17         This result is from an external source.      Latest Ref Rng & Units 07/19/2022   12:00 AM 07/12/2022   12:00 AM 06/22/2022   12:00 AM  CMP  BUN 4 - 21 11     14     13       Creatinine 0.5 - 1.1 0.6     0.5     0.7      Sodium 137 - 147 137     135     136      Potassium 3.5 - 5.1 mEq/L 3.6     3.6     4.1      Chloride 99 - 108 105     104     104      CO2 13 - 22 26     25     26       Calcium 8.7 - 10.7 8.6     8.5     8.9      Alkaline Phos 25 - 125 164     140     57      AST 13 - 35 23     27     21       ALT 7 - 35 U/L 16     17     18          This result is from an external source.     No results found for: "CEA1", "CEA" / No results found for:  "CEA1", "CEA" No results found for: "PSA1" No results found for: " " No results found for: "CAN125"  Lab Results  Component Value Date   TOTALPROTELP 6.0 04/14/2022   ALBUMINELP 3.5 04/14/2022   A1GS 0.3 04/14/2022   A2GS 0.5 04/14/2022   BETS 0.7 04/14/2022   GAMS 1.0 04/14/2022   MSPIKE Not Observed 04/14/2022   SPEI Comment 04/14/2022   No results found for: "TIBC", "FERRITIN", "IRONPCTSAT" Lab Results  Component Value Date   LDH 189 04/14/2022    STUDIES:  No results found.    HISTORY:   Past Medical History:  Diagnosis Date   Acute ST elevation myocardial infarction (STEMI) of inferior wall (HCC) 03/09/2022   Age-related osteoporosis without current pathological fracture 04/20/2021   Formatting of this note might be different from the original. [facility]a 04/2021. [facility]   Anxiety disorder 03/23/2021   Formatting of this note might be different from the original. Tried and failed escitalopram   At high risk for falls 01/23/2022   CAD (coronary artery disease)    a. s/p STEMI in 03/2022 with DES to prox/mid RCA.   Cerebral atherosclerosis    Delirium superimposed on dementia 03/10/2022   Essential hypertension    Fall 03/09/2022   Folate deficiency 04/27/2022   GERD without esophagitis 08/18/2021   History of arterial ischemic stroke 01/23/2022   Formatting of this note might be different from the original. 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/2021   Nocturia 09/26/2021   Osteoporosis    Primary osteoarthritis involving multiple joints 08/18/2021   Seborrheic keratosis 09/26/2021   Stroke (HCC)    Thrombocytopenia (HCC)    Vitamin B12 deficiency     Past Surgical History:  Procedure Laterality Date   CORONARY/GRAFT ACUTE MI REVASCULARIZATION N/A 03/09/2022   Procedure: Coronary/Graft Acute MI Revascularization;  Surgeon: Runell Gess,  MD;  [facility]: [facility] INV[facility];  Service: Cardiovascular;  Laterality: N/A;   LEFT HEART CATH AND CORONARY ANGIOGRAPHY N/A 03/09/2022   Procedure: LEFT HEART CATH AND CORONARY ANGIOGRAPHY;  Surgeon: Runell Gess, MD;  Location: [facility] INVASIVE CV LAB;  Service: Cardiovascular;  Laterality: N/A;    Family History  Problem Relation Age of Onset   Stroke Mother    CAD Father    Heart attack Father     Social History:  reports that she has never smoked. She has never used smokeless tobacco. She reports current alcohol use. She reports that she does not use drugs.The patient is accompanied by her husband today.  Allergies: No Known Allergies  Current Medications: Current Outpatient Medications  Medication Sig Dispense Refill   carvedilol (COREG) 6.25 MG tablet Take 6.25 mg by mouth 2 (two) times daily.     cyanocobalamin 1000 MCG tablet Take 1,000 mcg by mouth daily.     dexamethasone (DECADRON) 4 MG tablet Take 1 tablet (4 mg total) by mouth 3 (three) times daily. 9 tablet 1   donepezil (ARICEPT) 5 MG tablet Take 5 mg by mouth daily.     Folic Acid (FOLATE PO)      furosemide (LASIX) 20 MG tablet Take 20 mg by mouth as needed for fluid or edema.     haloperidol (HALDOL) 1 MG tablet TAKE 1 TABLET (1 MG TOTAL) BY MOUTH EVERY 8 (EIGHT) HOURS AS NEEDED FOR AGITATION. 60 tablet 1   LORazepam (ATIVAN) 1 MG tablet Take 1 tablet (1 mg total) by mouth every 8 (eight) hours. 30 tablet 0   losartan (COZAAR) 25 MG tablet Take 1 tablet by mouth daily.     methocarbamol (ROBAXIN) 500 MG tablet Take 500 mg by mouth.     metoprolol tartrate (LOPRESSOR) 25 MG tablet Take 1 tablet (25 mg total) by mouth 2 (two) times daily. 180 tablet 1   nitroGLYCERIN (NITROSTAT) 0.4 MG SL tablet Place 1 tablet (0.4 mg total) under the tongue every 5 (five) minutes x  3 doses as needed for chest pain. 25 tablet 3   pantoprazole (PROTONIX) 40 MG tablet Take 1 tablet (40 mg total) by mouth daily. 90 tablet 1   sertraline  (ZOLOFT) 50 MG tablet Take 50 mg by mouth daily.     No current facility-administered medications for this visit.      I,Gabriella Ballesteros,acting as a scribe for Dellia Beckwithhristine H McCarty, MD.,have documented all relevant documentation on the behalf of Dellia Beckwithhristine H McCarty, MD,as directed by  Dellia Beckwithhristine H McCarty, MD while in the presence of Dellia Beckwithhristine H McCarty, MD.

## 2022-06-23 ENCOUNTER — Inpatient Hospital Stay: Payer: Medicare Other

## 2022-06-23 DIAGNOSIS — D693 Immune thrombocytopenic purpura: Secondary | ICD-10-CM | POA: Diagnosis not present

## 2022-06-23 MED ORDER — SODIUM CHLORIDE 0.9% IV SOLUTION
250.0000 mL | Freq: Once | INTRAVENOUS | Status: AC
  Administered 2022-06-23: 250 mL via INTRAVENOUS

## 2022-06-23 MED ORDER — SODIUM CHLORIDE 0.9% FLUSH: 3.0000 mL | INTRAVENOUS | Status: DC | PRN

## 2022-06-23 MED ORDER — ACETAMINOPHEN 325 MG PO TABS
650.0000 mg | ORAL_TABLET | Freq: Once | ORAL | Status: AC
  Administered 2022-06-23: 650 mg via ORAL
  Filled 2022-06-23: qty 2

## 2022-06-23 MED ORDER — SODIUM CHLORIDE 0.9% FLUSH: 10.0000 mL | INTRAVENOUS | Status: DC | PRN

## 2022-06-23 MED ORDER — DIPHENHYDRAMINE HCL 25 MG PO CAPS
25.0000 mg | ORAL_CAPSULE | Freq: Once | ORAL | Status: AC
  Administered 2022-06-23: 25 mg via ORAL
  Filled 2022-06-23: qty 1

## 2022-06-23 NOTE — Patient Instructions (Signed)
Platelet Transfusion A platelet transfusion is a procedure in which a person receives donated platelets through an IV. Platelets are parts of blood that stick together and form a clot to help the body stop bleeding after an injury. If you have too few platelets, your blood may have trouble clotting. This may cause you to bleed and bruise very easily. You may need a platelet transfusion if you have a condition that causes a low number of platelets (thrombocytopenia). A platelet transfusion may be used to stop or prevent excessive bleeding. Tell a health care provider about: Any reactions you have had during previous transfusions. Any allergies you have. All medicines you are taking, including vitamins, herbs, eye drops, creams, and over-the-counter medicines. Any bleeding problems you have. Any surgeries you have had. Any medical conditions you have. Whether you are pregnant or may be pregnant. What are the risks? Generally, this is a safe procedure. However, problems may occur, including: Fever. Infection. Allergic reaction to the donated (donor) platelets. Your body's disease-fighting system (immune system) attacking the donor platelets (hemolytic reaction). This is rare. A rare reaction that causes lung damage (transfusion-related acute lung injury). What happens before the procedure? Medicines Ask your health care provider about: Changing or stopping your regular medicines. This is especially important if you are taking diabetes medicines or blood thinners. Taking medicines such as aspirin and ibuprofen. These medicines can thin your blood. Do not take these medicines unless your health care provider tells you to take them. Taking over-the-counter medicines, vitamins, herbs, and supplements. General instructions You will have a blood test to determine your blood type. Your blood type determines what kind of platelets you will be given. Follow instructions from your health care provider  about eating or drinking restrictions. If you have had an allergic reaction to a transfusion in the past, you may be given medicine to help prevent a reaction. Your temperature, blood pressure, pulse, and breathing will be monitored. What happens during the procedure?  An IV will be inserted into one of your veins. For your safety, two health care providers will verify your identity along with the donor platelets about to be infused. A bag of donor platelets will be connected to your IV. The platelets will flow into your bloodstream. This usually takes 30-60 minutes. Your temperature, blood pressure, pulse, and breathing will be monitored during the transfusion. This helps detect early signs of any reaction. You will also be monitored for other symptoms that may indicate a reaction, including chills, hives, or itching. If you have signs of a reaction at any time, your transfusion will be stopped, and you may be given medicine to help manage the reaction. When your transfusion is complete, your IV will be removed. Pressure may be applied to the IV site for a few minutes to stop any bleeding. The IV site will be covered with a bandage (dressing). The procedure may vary among health care providers and hospitals. What can I expect after the procedure? Your blood pressure, temperature, pulse, and breathing will be monitored until you leave the hospital or clinic. You may have some bruising and soreness at your IV site. Follow these instructions at home: Medicines Take over-the-counter and prescription medicines only as told by your health care provider. Talk with your health care provider before you take any medicines that contain aspirin or NSAIDs, such as ibuprofen. These medicines increase your risk for dangerous bleeding. IV site care Check your IV site every day for signs of infection. Check for:   Redness, swelling, or pain. Fluid or blood. If fluid or blood drains from your IV site, use your  hands to press down firmly on a bandage covering the area for a minute or two. Doing this should stop the bleeding. Warmth. Pus or a bad smell. General instructions Change or remove your dressing as told by your health care provider. Return to your normal activities as told by your health care provider. Ask your health care provider what activities are safe for you. Do not take baths, swim, or use a hot tub until your health care provider approves. Ask your health care provider if you may take showers. Keep all follow-up visits. This is important. Contact a health care provider if: You have a headache that does not go away with medicine. You have hives, rash, or itchy skin. You have nausea or vomiting. You feel unusually tired or weak. You have signs of infection at your IV site. Get help right away if: You have a fever or chills. You urinate less often than usual. Your urine is darker colored than normal. You have any of the following: Trouble breathing. Pain in your back, abdomen, or chest. Cool, clammy skin. A fast heartbeat. Summary Platelets are tiny pieces of blood cells that clump together to form a blood clot when you have an injury. If you have too few platelets, your blood may have trouble clotting. A platelet transfusion is a procedure in which you receive donated platelets through an IV. A platelet transfusion may be used to stop or prevent excessive bleeding. After the procedure, check your IV site every day for signs of infection. This information is not intended to replace advice given to you by your health care provider. Make sure you discuss any questions you have with your health care provider. Document Revised: 05/26/2021 Document Reviewed: 05/26/2021 Elsevier Patient Education  2023 [employer]  

## 2022-06-24 LAB — BPAM PLATELET PHERESIS
Blood Product Expiration Date: 202307202359
Blood Product Expiration Date: 202307222359
ISSUE DATE / TIME: 202307211158
Unit Type and Rh: 5100
Unit Type and Rh: 6200

## 2022-06-24 LAB — PREPARE PLATELET PHERESIS
Unit division: 0
Unit division: 0

## 2022-06-26 ENCOUNTER — Ambulatory Visit: Payer: Medicare Other | Admitting: Cardiology

## 2022-06-26 ENCOUNTER — Encounter: Payer: Self-pay | Admitting: Cardiology

## 2022-06-26 VITALS — BP 120/64 | HR 67 | Ht 65.0 in | Wt 119.0 lb

## 2022-06-26 DIAGNOSIS — I25118 Atherosclerotic heart disease of native coronary artery with other forms of angina pectoris: Secondary | ICD-10-CM | POA: Diagnosis not present

## 2022-06-26 DIAGNOSIS — E782 Mixed hyperlipidemia: Secondary | ICD-10-CM | POA: Diagnosis not present

## 2022-06-26 DIAGNOSIS — D693 Immune thrombocytopenic purpura: Secondary | ICD-10-CM | POA: Diagnosis not present

## 2022-06-26 DIAGNOSIS — I5032 Chronic diastolic (congestive) heart failure: Secondary | ICD-10-CM

## 2022-06-26 NOTE — Progress Notes (Signed)
Cardiology Office Note:    Date:  06/26/2022   ID:  Maria Holloway, DOB Jan 20, 1936, MRN [id number]  PCP:  Gordan Payment., MD  Cardiologist:  Gypsy Balsam, MD    Referring MD: Gordan Payment., MD   Chief Complaint  Patient presents with   Follow-up  History of very low platelets  History of Present Illness:    Maria Holloway is a 86 y.o. female  with past medical history significant for coronary artery disease in April 2023 she end up coming to the hospital with an STEMI she had complete occlusion midportion of the RCA which was addressed with drug-eluting stent, also essential hypertension, dyslipidemia, history of CVA, falls, dementia, GERD, vitamin B12 deficiency, osteoporosis, transferred to cytopenia.  Recently she ended up being readmitted to [facility] because of shortness of breath.  She was appropriately managed with aggressive diuresis comes to my office for follow-up.  She talks however she does not remember details her husband is with her in the room and majority of conversation was between me and her husband.   She comes today to my office for follow-up.  Overall doing fair.  She required platelets transfusion.  She got follow-up steroids to improve platelets in spite of that still thrombocytopenic.  Plavix and aspirin has been withheld.  Denies have any cardiac complaint.  No chest pain tightness squeezing pressure burning chest.  Past Medical History:  Diagnosis Date   Acute ST elevation myocardial infarction (STEMI) of inferior wall (HCC) 03/09/2022   Age-related osteoporosis without current pathological fracture 04/20/2021   Formatting of this note might be different from the original. [facility]a [facility]/2021. [facility]   Anxiety disorder 03/23/2021   Formatting of this note might be different from the original. Tried and failed escitalopram   At high risk for falls 01/23/2022   CAD (coronary artery disease)    a. s/p STEMI in 03/2022 with DES to prox/mid RCA.   Cerebral  atherosclerosis    Delirium superimposed on dementia 03/10/2022   Essential hypertension    Fall 03/09/2022   Folate deficiency 04/27/2022   GERD without esophagitis 08/18/2021   History of arterial ischemic stroke 01/23/2022   Formatting of this note might be different from the original. 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/2021   Nocturia 09/26/2021   Osteoporosis    Primary osteoarthritis involving multiple joints 08/18/2021   Seborrheic keratosis 09/26/2021   Stroke (HCC)    Thrombocytopenia (HCC)    Vitamin B12 deficiency     Past Surgical History:  Procedure Laterality Date   CORONARY/GRAFT ACUTE MI REVASCULARIZATION N/A 03/09/2022   Procedure: Coronary/Graft Acute MI Revascularization;  Surgeon: Runell Gess, MD;  [facility];  Service: Cardiovascular;  Laterality: N/A;   LEFT HEART CATH AND CORONARY ANGIOGRAPHY N/A 03/09/2022   Procedure: LEFT HEART CATH AND CORONARY ANGIOGRAPHY;  Surgeon: Runell Gess, MD;  Location: [facility];  Service: Cardiovascular;  Laterality: N/A;    Current Medications: Current Meds  Medication Sig   atorvastatin (LIPITOR) 80 MG tablet Take 80 mg by mouth at bedtime.   carvedilol (COREG) 6.25 MG tablet Take 6.25 mg by mouth 2 (two) times daily.   cyanocobalamin 1000 MCG tablet Take 1,000 mcg by mouth daily.   dexamethasone (DECADRON) 4 MG tablet Take 1 tablet (4 mg total) by mouth 3 (three) times daily.  donepezil (ARICEPT) 5 MG tablet Take 5 mg by mouth daily.   Folic Acid (FOLATE PO)    furosemide (LASIX) 20 MG tablet Take 20 mg by mouth as needed for fluid or edema.   haloperidol (HALDOL) 1 MG tablet TAKE 1 TABLET (1 MG TOTAL) BY MOUTH EVERY 8 (EIGHT) HOURS AS NEEDED FOR AGITATION.   losartan (COZAAR) 25 MG tablet Take 1 tablet by mouth daily.   metoprolol tartrate (LOPRESSOR) 25 MG tablet Take  1 tablet (25 mg total) by mouth 2 (two) times daily.   nitroGLYCERIN (NITROSTAT) 0.4 MG SL tablet Place 1 tablet (0.4 mg total) under the tongue every 5 (five) minutes x 3 doses as needed for chest pain.   pantoprazole (PROTONIX) 40 MG tablet Take 1 tablet (40 mg total) by mouth daily.   sertraline (ZOLOFT) 50 MG tablet Take 50 mg by mouth daily.   [DISCONTINUED] alendronate (FOSAMAX) 70 MG tablet Take 70 mg by mouth once a week.   [DISCONTINUED] aspirin EC 81 MG tablet Take 81 mg by mouth daily. Swallow whole.   [DISCONTINUED] clopidogrel (PLAVIX) 75 MG tablet Take 75 mg by mouth daily.     Allergies:   Patient has no known allergies.   Social History   Socioeconomic History   Marital status: Married    Spouse name: Not on file   Number of children: Not on file   Years of education: Not on file   Highest education level: Not on file  Occupational History   Not on file  Tobacco Use   Smoking status: Never   Smokeless tobacco: Never  Substance and Sexual Activity   Alcohol use: Yes    Comment: 1/2 glass of wine on New Year's   Drug use: Never   Sexual activity: Never  Other Topics Concern   Not on file  Social History Narrative   Not on file   Social Determinants of Health   Financial Resource Strain: Not on file  Food Insecurity: Not on file  Transportation Needs: Not on file  Physical Activity: Not on file  Stress: Not on file  Social Connections: Not on file     Family History: The patient's family history includes CAD in her father; Heart attack in her father; Stroke in her mother. ROS:   Please see the history of present illness.    All 14 point review of systems negative except as described per history of present illness  EKGs/Labs/Other Studies Reviewed:      Recent Labs: 03/09/2022: TSH 3.164 04/17/2022: NT-Pro BNP 6,471 06/22/2022: ALT 18; BUN 13; Creatinine 0.7; Hemoglobin 12.2; Platelets 11; Potassium 4.1; Sodium 136  Recent Lipid Panel    Component  Value Date/Time   CHOL 114 03/10/2022 0047   TRIG 90 03/10/2022 0047   HDL 34 (L) 03/10/2022 0047   CHOLHDL 3.4 03/10/2022 0047   VLDL 18 03/10/2022 0047   LDLCALC 62 03/10/2022 0047    Physical Exam:    VS:  BP 120/64 (BP Location: Left Arm, Patient Position: Sitting)   Pulse 67   Ht 5\' 5"  (1.651 m)   Wt 119 lb (54 kg)   SpO2 97%   BMI 19.80 kg/m     Wt Readings from Last 3 Encounters:  06/26/22 119 lb (54 kg)  06/22/22 118 lb 8 oz (53.8 kg)  06/15/22 119 lb 3.2 oz (54.1 kg)     GEN:  Well nourished, well developed in no acute distress HEENT: Normal NECK: No JVD; No carotid bruits  LYMPHATICS: No lymphadenopathy CARDIAC: RRR, no murmurs, no rubs, no gallops RESPIRATORY:  Clear to auscultation without rales, wheezing or rhonchi  ABDOMEN: Soft, non-tender, non-distended MUSCULOSKELETAL:  No edema; No deformity  SKIN: Warm and dry LOWER EXTREMITIES: no swelling NEUROLOGIC:  Alert and oriented x 3 PSYCHIATRIC:  Normal affect   ASSESSMENT:    1. Coronary artery disease of native artery of native heart with stable angina pectoris ([place])   2. CHF NYHA class I, chronic, diastolic ([place])   3. Immune thrombocytopenic purpura ([place])   4. Mixed hyperlipidemia    PLAN:    In order of problems listed above:  Coronary disease stable from that point review and appropriate medications which I will continue.  Low   Medication Adjustments/Labs and Tests Ordered: Current medicines are reviewed at length with the patient today.  Concerns regarding medicines are outlined above.  Orders Placed This Encounter  Procedures   EKG 12-Lead   Medication changes: No orders of the defined types were placed in this encounter.   Signed, Park Liter, MD, [facility] 06/26/2022 4:08 PM    [facility]

## 2022-06-26 NOTE — Patient Instructions (Signed)

## 2022-06-27 ENCOUNTER — Encounter: Payer: Self-pay | Admitting: Cardiology

## 2022-06-28 ENCOUNTER — Encounter: Payer: Self-pay | Admitting: Oncology

## 2022-06-29 ENCOUNTER — Inpatient Hospital Stay: Payer: Medicare Other

## 2022-06-29 ENCOUNTER — Other Ambulatory Visit: Payer: Self-pay | Admitting: Hematology and Oncology

## 2022-06-29 ENCOUNTER — Telehealth: Payer: Self-pay | Admitting: Dietician

## 2022-06-29 ENCOUNTER — Encounter: Payer: Self-pay | Admitting: Oncology

## 2022-06-29 ENCOUNTER — Inpatient Hospital Stay: Payer: Medicare Other | Admitting: Oncology

## 2022-06-29 ENCOUNTER — Other Ambulatory Visit: Payer: Self-pay | Admitting: Pharmacist

## 2022-06-29 VITALS — BP 136/60 | HR 68 | Resp 18 | Ht 65.0 in | Wt 120.3 lb

## 2022-06-29 VITALS — BP 127/63 | HR 72 | Resp 18

## 2022-06-29 DIAGNOSIS — D693 Immune thrombocytopenic purpura: Secondary | ICD-10-CM

## 2022-06-29 LAB — CBC AND DIFFERENTIAL
HCT: 31 — AB (ref 36–46)
Hemoglobin: 10.3 — AB (ref 12.0–16.0)
Neutrophils Absolute: 2.38
Platelets: 11 10*3/uL — AB (ref 150–400)
WBC: 3.4

## 2022-06-29 LAB — CBC: RBC: 3.66 — AB (ref 3.87–5.11)

## 2022-06-29 MED ORDER — ROMIPLOSTIM 125 MCG ~~LOC~~ SOLR
1.0000 ug/kg | Freq: Once | SUBCUTANEOUS | Status: AC
  Administered 2022-06-29: 55 ug via SUBCUTANEOUS
  Filled 2022-06-29: qty 0.11

## 2022-06-29 MED ORDER — LORAZEPAM 1 MG PO TABS: 1.0000 mg | ORAL_TABLET | Freq: Three times a day (TID) | ORAL | 0 refills | Status: DC

## 2022-06-29 NOTE — Telephone Encounter (Signed)
Patient screened on MST. First attempt to reach. Called home # and spouse answered.  He said he was just getting patient ready to go see MD today. I encouraged him to ask for samples and coupons for ONS. Provided my contact information on text and let him know I would call to follow up on Tuesday.  Gennaro Africa, RDN, LDN Registered Dietitian, [facility] Part Time Remote (Usual office hours: Tuesday-Thursday) Cell: [phone]

## 2022-06-29 NOTE — Patient Instructions (Signed)
Romiplostim injection ?What is this medication? ?ROMIPLOSTIM (roe mi PLOE stim) helps your body make more platelets. This medicine is used to treat low platelets caused by chronic idiopathic thrombocytopenic purpura (ITP) or a bone marrow syndrome caused by radiation sickness. ?This medicine may be used for other purposes; ask your health care provider or pharmacist if you have questions. ?COMMON BRAND NAME(S): Nplate ?What should I tell my care team before I take this medication? ?They need to know if you have any of these conditions: ?blood clots ?myelodysplastic syndrome ?an unusual or allergic reaction to romiplostim, mannitol, other medicines, foods, dyes, or preservatives ?pregnant or trying to get pregnant ?breast-feeding ?How should I use this medication? ?This medicine is injected under the skin. It is given by a health care provider in a hospital or clinic setting. ?A special MedGuide will be given to you before each treatment. Be sure to read this information carefully each time. ?Talk to your health care provider about the use of this medicine in children. While it may be prescribed for children as young as newborns for selected conditions, precautions do apply. ?Overdosage: If you think you have taken too much of this medicine contact a poison control center or emergency room at once. ?NOTE: This medicine is only for you. Do not share this medicine with others. ?What if I miss a dose? ?Keep appointments for follow-up doses. It is important not to miss your dose. Call your health care provider if you are unable to keep an appointment. ?What may interact with this medication? ?Interactions are not expected. ?This list may not describe all possible interactions. Give your health care provider a list of all the medicines, herbs, non-prescription drugs, or dietary supplements you use. Also tell them if you smoke, drink alcohol, or use illegal drugs. Some items may interact with your medicine. ?What should I  watch for while using this medication? ?Visit your health care provider for regular checks on your progress. You may need blood work done while you are taking this medicine. Your condition will be monitored carefully while you are receiving this medicine. It is important not to miss any appointments. ?What side effects may I notice from receiving this medication? ?Side effects that you should report to your doctor or health care professional as soon as possible: ?allergic reactions (skin rash, itching or hives; swelling of the face, lips, or tongue) ?bleeding (bloody or black, tarry stools; red or dark brown urine; spitting up blood or brown material that looks like coffee grounds; red spots on the skin; unusual bruising or bleeding from the eyes, gums, or nose) ?blood clot (chest pain; shortness of breath; pain, swelling, or warmth in the leg) ?stroke (changes in vision; confusion; trouble speaking or understanding; severe headaches; sudden numbness or weakness of the face, arm or leg; trouble walking; dizziness; loss of balance or coordination) ?Side effects that usually do not require medical attention (report to your doctor or health care professional if they continue or are bothersome): ?diarrhea ?dizziness ?headache ?joint pain ?muscle pain ?stomach pain ?trouble sleeping ?This list may not describe all possible side effects. Call your doctor for medical advice about side effects. You may report side effects to FDA at 1-800-FDA-1088. ?Where should I keep my medication? ?This medicine is given in a hospital or clinic. It will not be stored at home. ?NOTE: This sheet is a summary. It may not cover all possible information. If you have questions about this medicine, talk to your doctor, pharmacist, or health care provider. ??   2023 Elsevier/Gold Standard (2021-10-21 00:00:00) ? ?

## 2022-06-29 NOTE — Progress Notes (Signed)
[facility]  [address] 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  We have treated her with pulsed dexamethasone due to concern over tolerance of high-dose steroids for any length of time due to dementia and agitation.  She had trouble tolerating decreased dose for 5 days, so we reduced this to 3 days when she was treated on June 29.  Unfortunately, she did not have an immediate response and was hospitalized with worsening thrombocytopenia, in addition to hypotension and dehydration.  She received 2 more doses of steroids but had little to no response.  Unfortunately she has had a decline in her general health and continues to lose weight.  We feel this is due to her dementia, and she has had many falls.  Dementia, worsening I do not feel she could sit still for IVIG infusions, and the same for rituximab.  She is too elderly and frail to consider splenectomy.  We will therefore continue to monitor her and see her on a weekly basis.  I will order Ativan to help her sleep at night and for periods of agitation since the Haldol is not controlling it.   We tried another abbreviated course of dexamethasone without success and we have limited options for her.  She did respond the first time but had excessive agitation that required Haldol for control.  We have had discussions about hospice in view of her worsening dementia but her performance status fluctuates.  We will therefore try a trial of Nplate injections weekly with labs weekly to see if she responds.  We discussed the risks and benefits of this approach versus IVIG or rituximab and feel this would be the simplest for her to tolerate. The patient's husband understands the plans  discussed today and is in agreement with them.  They know to contact our office if she develops concerns prior to her next appointment.     I provided 20 minutes of face-to-face time during this encounter and > 50% was spent counseling as documented under my assessment and plan.    Derwood Kaplan, MD  [facility] [address] [place] [address] Dept: [phone] Dept Fax: [phone]   Orders Placed This Encounter  Procedures  . CBC and differential    This external order was created through the Results Console.  . CBC    This external order was created through the Results Console.      CHIEF COMPLAINT:  CC: ITP  Current Treatment: Pulsed dexamethasone as needed  HISTORY OF PRESENT ILLNESS:  Maria Holloway is a 86 y.o. female with dementia with immune thrombocytopenic purpura.  We began seeing her on May 12th, at which time her platelet count was 31,000.  She has a history of CVA in February, for which she was on aspirin 81 mg daily.  On April 6th, she presented to [facility] ER after a fall with significant bruising of her face.  She had mild thrombocytopenia with a platelet count of 119,000.  White blood count and hemoglobin were normal.  CT head did not reveal any intracranial injury.  There was a forehead hematoma without calvarial fracture. Chronic small vessel disease with infarcts was seen.  EKG revealed ST elevation  consistent with an acute myocardial infarction.  She was transferred to [facility] and underwent cardiac catheterization with stenting of the right coronary artery.  Her folate level was mildly low at 5.6.  She was discharged home on aspirin 81 mg daily and Brilinta 90 mg twice daily.  She was seen at [facility] ER again on April 12th with general weakness and her platelet count remained low at 117,000.  Her hemoglobin was mildly low at 11.7 and bilirubin was 2.  She presented  to the ER again on April 15th with restlessness and anxiety, which she had been experiencing since her hospitalization for MRI.  She had persistent thrombocytopenia with a platelet count of 117,000, but normal white count and hemoglobin. The bilirubin was 1.5.  No other specific concerns were found.  She presented to the [facility] ER again on April 28th with general weakness and dizziness. Her CT head did not reveal any acute abnormality.  CTA chest did not reveal acute pulmonary embolism.  There were multiple pulmonary nodules the largest measuring 2.8 cm.  Repeat CT or PET scan was recommended.  Small bilateral pleural effusions with mild interstitial edema was seen.  She also had elevation of her BNP.  She was admitted and diuresed.  Echocardiogram revealed mild concentric left ventricular hypertrophy with overall normal left ventricular function with an ejection fraction of 55 to 60%.  She had persistent thrombocytopenia with a platelet count of 66,000 to 79,00 during her 3-day admission.  Her hemoglobin fluctuated up and down but remained above 11.  White blood count remained normal and bilirubin was normal.  She saw Dr. Shary Decamp for follow-up on May 8th at which time her platelet count had dropped to 29,000, so she was referred here.  White blood cells, hemoglobin and bilirubin remained normal.  Dr. Allyson Sabal switched from [facility] to Plavix due to thrombocytopenia on May 13th.   Evaluation in our office revealed persistent mild folate deficiency with folic acid of 5.2.  B12 was normal.  There was no evidence of hemolysis.  Serum protein electrophoresis did not reveal a monoclonal protein.  We felt this was most consistent with immune thrombocytopenic purpura.  She was treated with pulse dexamethasone 12 mg daily for 5 days, which is a reduced dose, as we were concerned higher doses or treatment for an extended period of time would worsen her dementia and agitation.  She was also given haloperidol 1 mg every  8 hours to use as needed for agitation.  She tolerated dexamethasone better than expected, but did require Haldol fairly regularly.  Initially, her platelet count went up to 69,000, but since that time, it had steadily decreased.  As her platelet count remained above 20,000, we opted for observation due to difficulty tolerating the dexamethasone.  She also had borderline anemia.  She has been steadily losing weight.   Her platelets dropped down to 23,000 on June 29, so she was given another pulse dexamethasone at a reduced dose of 4 mg 3 times daily for 3 days due to previous poor tolerance.  Her husband was unable to bring her for her appointment the following week.  She presented to the emergency room on July 10 with near syncope, hypotension and dehydration.  Her platelet count was 14,000 despite the pulse of dexamethasone.  She was admitted for further management of her hypotension and dehydration.  She received 1 platelet pheresis and started on dexamethasone 20 mg daily.  She and her husband insisted on discharge  later that day, so she was sent out on oral dexamethasone.  We arranged for her to have a platelet transfusion as an outpatient on July 12.  INTERVAL HISTORY:  Maria Holloway is here today for repeat clinical assessment.  Her platelet count went up to 55,000 the day after her transfusion, on July 13, but was right back down to 11,000 one week later and remains 11,000 today.  She had another fall and went to the emergency room where she was diagnosed with a fracture of the inferior right pubic ramus.  She does not seem to be an much pain but is using a walker.  She has a very flat affect and is fairly noncommunicative today.  She reports fatigue and decreased appetite.  He states she has not had any bleeding or significant bruising.  Her Plavix has been on hold since her platelets were less than 20,000.  He states that he has to force her to eat she is eating about 50% of normal.  She is drinking Ensure  1-2 daily.  Her husband tells me the Haldol is not allowing her to sleep at night and he requests something else.  We will try Ativan 1 mg to use as needed, mainly at night.  We will transfuse her with platelets again this week but we had a long discussion about the fact that this is not a practical solution to her thrombocytopenia.  I do not think she could cooperate with infusions of IVIG or rituximab.  We therefore suggested Nplate injections weekly and reviewed the risks and benefits.  He would like to proceed with that and so we will start it.  REVIEW OF SYSTEMS:  Review of Systems  Constitutional:  Negative for appetite change, chills, fatigue, fever and unexpected weight change.  HENT:   Negative for lump/mass, mouth sores and sore throat.   Respiratory:  Negative for cough and shortness of breath.   Cardiovascular:  Negative for chest pain and leg swelling.  Gastrointestinal:  Negative for abdominal pain, constipation, diarrhea, nausea and vomiting.  Endocrine: Negative for hot flashes.  Genitourinary:  Negative for difficulty urinating, dysuria, frequency and hematuria.   Musculoskeletal:  Negative for arthralgias, back pain and myalgias.  Skin:  Negative for rash.  Neurological:  Negative for dizziness and headaches.  Hematological:  Negative for adenopathy. Does not bruise/bleed easily.  Psychiatric/Behavioral:  Negative for depression and sleep disturbance. The patient is not nervous/anxious.      VITALS:  Blood pressure 136/60, pulse 68, resp. rate 18, height 5\' 5"  (1.651 m), weight 120 lb 4.8 oz (54.6 kg), SpO2 98 %.  Wt Readings from Last 3 Encounters:  07/19/22 117 lb 11.2 oz (53.4 kg)  07/12/22 120 lb (54.4 kg)  07/12/22 120 lb 9.6 oz (54.7 kg)    Body mass index is 20.02 kg/m.  Performance status (ECOG): 2 - Symptomatic, <50% confined to bed  PHYSICAL EXAM:  Physical Exam Vitals and nursing note reviewed.  Constitutional:      General: She is not in acute  distress.    Appearance: Normal appearance.  HENT:     Head: Normocephalic and atraumatic.     Mouth/Throat:     Mouth: Mucous membranes are moist.     Pharynx: Oropharynx is clear. No oropharyngeal exudate or posterior oropharyngeal erythema.  Eyes:     General: No scleral icterus.    Extraocular Movements: Extraocular movements intact.     Conjunctiva/sclera: Conjunctivae normal.     Pupils: Pupils are equal,  round, and reactive to light.  Cardiovascular:     Rate and Rhythm: Normal rate and regular rhythm.     Heart sounds: Normal heart sounds. No murmur heard.    No friction rub. No gallop.  Pulmonary:     Effort: Pulmonary effort is normal.     Breath sounds: Normal breath sounds. No wheezing, rhonchi or rales.  Abdominal:     General: There is no distension.     Palpations: Abdomen is soft. There is no hepatomegaly, splenomegaly or mass.     Tenderness: There is no abdominal tenderness.  Musculoskeletal:        General: Normal range of motion.     Cervical back: Normal range of motion and neck supple. No tenderness.     Right lower leg: No edema.     Left lower leg: No edema.  Lymphadenopathy:     Cervical: No cervical adenopathy.     Upper Body:     Right upper body: No supraclavicular or axillary adenopathy.     Left upper body: No supraclavicular or axillary adenopathy.     Lower Body: No right inguinal adenopathy. No left inguinal adenopathy.  Skin:    General: Skin is warm and dry.     Coloration: Skin is not jaundiced.     Findings: Bruising present. No rash.     Comments: She has severe bruising of her face.  Neurological:     Mental Status: She is alert and oriented to person, place, and time.     Cranial Nerves: No cranial nerve deficit.  Psychiatric:        Mood and Affect: Mood normal.        Behavior: Behavior normal.        Thought Content: Thought content normal.    LABS:      Latest Ref Rng & Units 07/19/2022   12:00 AM 07/12/2022   12:00 AM  07/06/2022   12:00 AM  CBC  WBC  5.3     4.7     4.7      Hemoglobin 12.0 - 16.0 11.4     10.3     9.1      Hematocrit 36 - 46 35     31     28      Platelets 150 - 400 K/uL 129     54     17         This result is from an external source.      Latest Ref Rng & Units 07/19/2022   12:00 AM 07/12/2022   12:00 AM 06/22/2022   12:00 AM  CMP  BUN 4 - 21 11     14     13       Creatinine 0.5 - 1.1 0.6     0.5     0.7      Sodium 137 - 147 137     135     136      Potassium 3.5 - 5.1 mEq/L 3.6     3.6     4.1      Chloride 99 - 108 105     104     104      CO2 13 - 22 26     25     26       Calcium 8.7 - 10.7 8.6     8.5     8.9      Alkaline Phos 25 -  125 164     140     57      AST 13 - 35 23     27     21       ALT 7 - 35 U/L 16     17     18          This result is from an external source.     No results found for: "CEA1", "CEA" / No results found for: "CEA1", "CEA" No results found for: "PSA1" No results found for: "[id number]" No results found for: "CAN125"  Lab Results  Component Value Date   TOTALPROTELP 6.0 04/14/2022   ALBUMINELP 3.5 04/14/2022   A1GS 0.3 04/14/2022   A2GS 0.5 04/14/2022   BETS 0.7 04/14/2022   GAMS 1.0 04/14/2022   MSPIKE Not Observed 04/14/2022   SPEI Comment 04/14/2022   No results found for: "TIBC", "FERRITIN", "IRONPCTSAT" Lab Results  Component Value Date   LDH 189 04/14/2022    STUDIES:  No results found.    HISTORY:   Past Medical History:  Diagnosis Date  . Acute ST elevation myocardial infarction (STEMI) of inferior wall ([place]) 03/09/2022  . Age-related osteoporosis without current pathological fracture 04/20/2021   Formatting of this note might be different from the original. Dexa 04/2021. At arm.  . Anxiety disorder 03/23/2021   Formatting of this note might be different from the original. Tried and failed escitalopram  . At high risk for falls 01/23/2022  . CAD (coronary artery disease)    a. s/p STEMI in 03/2022 with DES to prox/mid  RCA.  Marland Kitchen Cerebral atherosclerosis   . Delirium superimposed on dementia 03/10/2022  . Essential hypertension   . Fall 03/09/2022  . Folate deficiency 04/27/2022  . GERD without esophagitis 08/18/2021  . History of arterial ischemic stroke 01/23/2022   Formatting of this note might be different from the original. 01/2022  . Hyperlipidemia   . Malaise and fatigue 03/23/2021  . Mild cognitive impairment   . Mild episode of recurrent major depressive disorder ([place]) 09/26/2021  . Nocturia 09/26/2021  . Osteoporosis   . Primary osteoarthritis involving multiple joints 08/18/2021  . Seborrheic keratosis 09/26/2021  . Stroke ([place])   . Thrombocytopenia ([place])   . Vitamin B12 deficiency     Past Surgical History:  Procedure Laterality Date  . CORONARY/GRAFT ACUTE MI REVASCULARIZATION N/A 03/09/2022   Procedure: Coronary/Graft Acute MI Revascularization;  Surgeon: Lorretta Harp, MD;  Location: [facility];  Service: Cardiovascular;  Laterality: N/A;  . LEFT HEART CATH AND CORONARY ANGIOGRAPHY N/A 03/09/2022   Procedure: LEFT HEART CATH AND CORONARY ANGIOGRAPHY;  Surgeon: Lorretta Harp, MD;  Location: [facility];  Service: Cardiovascular;  Laterality: N/A;    Family History  Problem Relation Age of Onset  . Stroke Mother   . CAD Father   . Heart attack Father     Social History:  reports that she has never smoked. She has never used smokeless tobacco. She reports current alcohol use. She reports that she does not use drugs.The patient is accompanied by her husband today.  Allergies: No Known Allergies  Current Medications: Current Outpatient Medications  Medication Sig Dispense Refill  . carvedilol (COREG) 6.25 MG tablet Take 6.25 mg by mouth 2 (two) times daily.    . cyanocobalamin 1000 MCG tablet Take 1,000 mcg by mouth daily.    Marland Kitchen dexamethasone (DECADRON) 4 MG tablet Take 1 tablet (4 mg total) by mouth  3 (three) times daily. 9 tablet 1  . donepezil (ARICEPT) 5 MG  tablet Take 5 mg by mouth daily.    . Folic Acid (FOLATE PO)     . furosemide (LASIX) 20 MG tablet Take 20 mg by mouth as needed for fluid or edema.    . haloperidol (HALDOL) 1 MG tablet TAKE 1 TABLET (1 MG TOTAL) BY MOUTH EVERY 8 (EIGHT) HOURS AS NEEDED FOR AGITATION. 60 tablet 1  . LORazepam (ATIVAN) 1 MG tablet Take 1 tablet (1 mg total) by mouth every 8 (eight) hours. 30 tablet 0  . losartan (COZAAR) 25 MG tablet Take 1 tablet by mouth daily.    . methocarbamol (ROBAXIN) 500 MG tablet Take 500 mg by mouth.    . metoprolol tartrate (LOPRESSOR) 25 MG tablet Take 1 tablet (25 mg total) by mouth 2 (two) times daily. 180 tablet 1  . nitroGLYCERIN (NITROSTAT) 0.4 MG SL tablet Place 1 tablet (0.4 mg total) under the tongue every 5 (five) minutes x 3 doses as needed for chest pain. 25 tablet 3  . pantoprazole (PROTONIX) 40 MG tablet Take 1 tablet (40 mg total) by mouth daily. 90 tablet 1  . sertraline (ZOLOFT) 50 MG tablet Take 50 mg by mouth daily.     No current facility-administered medications for this visit.

## 2022-06-30 ENCOUNTER — Inpatient Hospital Stay: Payer: Medicare Other

## 2022-06-30 DIAGNOSIS — D693 Immune thrombocytopenic purpura: Secondary | ICD-10-CM

## 2022-06-30 MED ORDER — ACETAMINOPHEN 325 MG PO TABS
650.0000 mg | ORAL_TABLET | Freq: Once | ORAL | Status: AC
  Administered 2022-06-30: 650 mg via ORAL
  Filled 2022-06-30: qty 2

## 2022-06-30 MED ORDER — DIPHENHYDRAMINE HCL 25 MG PO CAPS
25.0000 mg | ORAL_CAPSULE | Freq: Once | ORAL | Status: AC
  Administered 2022-06-30: 25 mg via ORAL
  Filled 2022-06-30: qty 1

## 2022-06-30 MED ORDER — SODIUM CHLORIDE 0.9% IV SOLUTION
250.0000 mL | Freq: Once | INTRAVENOUS | Status: AC
  Administered 2022-06-30: 250 mL via INTRAVENOUS

## 2022-06-30 NOTE — Patient Instructions (Signed)
Platelet Transfusion A platelet transfusion is a procedure in which a person receives donated platelets through an IV. Platelets are parts of blood that stick together and form a clot to help the body stop bleeding after an injury. If you have too few platelets, your blood may have trouble clotting. This may cause you to bleed and bruise very easily. You may need a platelet transfusion if you have a condition that causes a low number of platelets (thrombocytopenia). A platelet transfusion may be used to stop or prevent excessive bleeding. Tell a health care provider about: Any reactions you have had during previous transfusions. Any allergies you have. All medicines you are taking, including vitamins, herbs, eye drops, creams, and over-the-counter medicines. Any bleeding problems you have. Any surgeries you have had. Any medical conditions you have. Whether you are pregnant or may be pregnant. What are the risks? Generally, this is a safe procedure. However, problems may occur, including: Fever. Infection. Allergic reaction to the donated (donor) platelets. Your body's disease-fighting system (immune system) attacking the donor platelets (hemolytic reaction). This is rare. A rare reaction that causes lung damage (transfusion-related acute lung injury). What happens before the procedure? Medicines Ask your health care provider about: Changing or stopping your regular medicines. This is especially important if you are taking diabetes medicines or blood thinners. Taking medicines such as aspirin and ibuprofen. These medicines can thin your blood. Do not take these medicines unless your health care provider tells you to take them. Taking over-the-counter medicines, vitamins, herbs, and supplements. General instructions You will have a blood test to determine your blood type. Your blood type determines what kind of platelets you will be given. Follow instructions from your health care provider  about eating or drinking restrictions. If you have had an allergic reaction to a transfusion in the past, you may be given medicine to help prevent a reaction. Your temperature, blood pressure, pulse, and breathing will be monitored. What happens during the procedure?  An IV will be inserted into one of your veins. For your safety, two health care providers will verify your identity along with the donor platelets about to be infused. A bag of donor platelets will be connected to your IV. The platelets will flow into your bloodstream. This usually takes 30-60 minutes. Your temperature, blood pressure, pulse, and breathing will be monitored during the transfusion. This helps detect early signs of any reaction. You will also be monitored for other symptoms that may indicate a reaction, including chills, hives, or itching. If you have signs of a reaction at any time, your transfusion will be stopped, and you may be given medicine to help manage the reaction. When your transfusion is complete, your IV will be removed. Pressure may be applied to the IV site for a few minutes to stop any bleeding. The IV site will be covered with a bandage (dressing). The procedure may vary among health care providers and hospitals. What can I expect after the procedure? Your blood pressure, temperature, pulse, and breathing will be monitored until you leave the hospital or clinic. You may have some bruising and soreness at your IV site. Follow these instructions at home: Medicines Take over-the-counter and prescription medicines only as told by your health care provider. Talk with your health care provider before you take any medicines that contain aspirin or NSAIDs, such as ibuprofen. These medicines increase your risk for dangerous bleeding. IV site care Check your IV site every day for signs of infection. Check for:   Redness, swelling, or pain. Fluid or blood. If fluid or blood drains from your IV site, use your  hands to press down firmly on a bandage covering the area for a minute or two. Doing this should stop the bleeding. Warmth. Pus or a bad smell. General instructions Change or remove your dressing as told by your health care provider. Return to your normal activities as told by your health care provider. Ask your health care provider what activities are safe for you. Do not take baths, swim, or use a hot tub until your health care provider approves. Ask your health care provider if you may take showers. Keep all follow-up visits. This is important. Contact a health care provider if: You have a headache that does not go away with medicine. You have hives, rash, or itchy skin. You have nausea or vomiting. You feel unusually tired or weak. You have signs of infection at your IV site. Get help right away if: You have a fever or chills. You urinate less often than usual. Your urine is darker colored than normal. You have any of the following: Trouble breathing. Pain in your back, abdomen, or chest. Cool, clammy skin. A fast heartbeat. Summary Platelets are tiny pieces of blood cells that clump together to form a blood clot when you have an injury. If you have too few platelets, your blood may have trouble clotting. A platelet transfusion is a procedure in which you receive donated platelets through an IV. A platelet transfusion may be used to stop or prevent excessive bleeding. After the procedure, check your IV site every day for signs of infection. This information is not intended to replace advice given to you by your health care provider. Make sure you discuss any questions you have with your health care provider. Document Revised: 05/26/2021 Document Reviewed: 05/26/2021 Elsevier Patient Education  2023 Elsevier Inc. Romiplostim injection What is this medication? ROMIPLOSTIM (roe mi PLOE stim) helps your body make more platelets. This medicine is used to treat low platelets caused by  chronic idiopathic thrombocytopenic purpura (ITP) or a bone marrow syndrome caused by radiation sickness. This medicine may be used for other purposes; ask your health care provider or pharmacist if you have questions. COMMON BRAND NAME(S): Nplate What should I tell my care team before I take this medication? They need to know if you have any of these conditions: blood clots myelodysplastic syndrome an unusual or allergic reaction to romiplostim, mannitol, other medicines, foods, dyes, or preservatives pregnant or trying to get pregnant breast-feeding How should I use this medication? This medicine is injected under the skin. It is given by a health care provider in a hospital or clinic setting. A special MedGuide will be given to you before each treatment. Be sure to read this information carefully each time. Talk to your health care provider about the use of this medicine in children. While it may be prescribed for children as young as newborns for selected conditions, precautions do apply. Overdosage: If you think you have taken too much of this medicine contact a poison control center or emergency room at once. NOTE: This medicine is only for you. Do not share this medicine with others. What if I miss a dose? Keep appointments for follow-up doses. It is important not to miss your dose. Call your health care provider if you are unable to keep an appointment. What may interact with this medication? Interactions are not expected. This list may not describe all possible interactions. Give your  health care provider a list of all the medicines, herbs, non-prescription drugs, or dietary supplements you use. Also tell them if you smoke, drink alcohol, or use illegal drugs. Some items may interact with your medicine. What should I watch for while using this medication? Visit your health care provider for regular checks on your progress. You may need blood work done while you are taking this  medicine. Your condition will be monitored carefully while you are receiving this medicine. It is important not to miss any appointments. What side effects may I notice from receiving this medication? Side effects that you should report to your doctor or health care professional as soon as possible: allergic reactions (skin rash, itching or hives; swelling of the face, lips, or tongue) bleeding (bloody or black, tarry stools; red or dark brown urine; spitting up blood or brown material that looks like coffee grounds; red spots on the skin; unusual bruising or bleeding from the eyes, gums, or nose) blood clot (chest pain; shortness of breath; pain, swelling, or warmth in the leg) stroke (changes in vision; confusion; trouble speaking or understanding; severe headaches; sudden numbness or weakness of the face, arm or leg; trouble walking; dizziness; loss of balance or coordination) Side effects that usually do not require medical attention (report to your doctor or health care professional if they continue or are bothersome): diarrhea dizziness headache joint pain muscle pain stomach pain trouble sleeping This list may not describe all possible side effects. Call your doctor for medical advice about side effects. You may report side effects to FDA at 1-800-FDA-1088. Where should I keep my medication? This medicine is given in a hospital or clinic. It will not be stored at home. NOTE: This sheet is a summary. It may not cover all possible information. If you have questions about this medicine, talk to your doctor, pharmacist, or health care provider.  2023 Elsevier/Gold Standard (2021-10-21 00:00:00)

## 2022-07-02 ENCOUNTER — Encounter: Payer: Self-pay | Admitting: Oncology

## 2022-07-03 ENCOUNTER — Telehealth: Payer: Self-pay

## 2022-07-03 DIAGNOSIS — D693 Immune thrombocytopenic purpura: Secondary | ICD-10-CM | POA: Diagnosis not present

## 2022-07-03 LAB — BPAM PLATELET PHERESIS
Blood Product Expiration Date: 202307312359
ISSUE DATE / TIME: 202307280947
Unit Type and Rh: 7300

## 2022-07-03 LAB — PREPARE PLATELET PHERESIS: Unit division: 0

## 2022-07-03 NOTE — Telephone Encounter (Signed)
-----   Message from Dellia Beckwith, MD sent at 07/02/2022  6:55 PM EDT ----- Regarding: ER She is in the ER tonight with platelets 13,000.  I recommended adm for transfusion, figured we prob couldn't do on short notice as OP.  Pls print the ER note & see if admitted.  If so, print the H&P

## 2022-07-03 NOTE — Telephone Encounter (Signed)
Printed and gave to Dr. McCarty ?

## 2022-07-04 ENCOUNTER — Other Ambulatory Visit: Payer: Self-pay | Admitting: Pharmacist

## 2022-07-04 ENCOUNTER — Inpatient Hospital Stay: Payer: Medicare Other | Attending: Hematology and Oncology | Admitting: Dietician

## 2022-07-04 DIAGNOSIS — J90 Pleural effusion, not elsewhere classified: Secondary | ICD-10-CM | POA: Insufficient documentation

## 2022-07-04 DIAGNOSIS — F0390 Unspecified dementia without behavioral disturbance: Secondary | ICD-10-CM | POA: Insufficient documentation

## 2022-07-04 DIAGNOSIS — E785 Hyperlipidemia, unspecified: Secondary | ICD-10-CM | POA: Insufficient documentation

## 2022-07-04 DIAGNOSIS — Z8673 Personal history of transient ischemic attack (TIA), and cerebral infarction without residual deficits: Secondary | ICD-10-CM | POA: Insufficient documentation

## 2022-07-04 DIAGNOSIS — Z7902 Long term (current) use of antithrombotics/antiplatelets: Secondary | ICD-10-CM | POA: Insufficient documentation

## 2022-07-04 DIAGNOSIS — E538 Deficiency of other specified B group vitamins: Secondary | ICD-10-CM | POA: Insufficient documentation

## 2022-07-04 DIAGNOSIS — M810 Age-related osteoporosis without current pathological fracture: Secondary | ICD-10-CM | POA: Insufficient documentation

## 2022-07-04 DIAGNOSIS — K219 Gastro-esophageal reflux disease without esophagitis: Secondary | ICD-10-CM | POA: Insufficient documentation

## 2022-07-04 DIAGNOSIS — J811 Chronic pulmonary edema: Secondary | ICD-10-CM | POA: Insufficient documentation

## 2022-07-04 DIAGNOSIS — D693 Immune thrombocytopenic purpura: Secondary | ICD-10-CM | POA: Insufficient documentation

## 2022-07-04 DIAGNOSIS — Z79899 Other long term (current) drug therapy: Secondary | ICD-10-CM | POA: Insufficient documentation

## 2022-07-04 DIAGNOSIS — Z9181 History of falling: Secondary | ICD-10-CM | POA: Insufficient documentation

## 2022-07-04 DIAGNOSIS — I252 Old myocardial infarction: Secondary | ICD-10-CM | POA: Insufficient documentation

## 2022-07-04 DIAGNOSIS — Z7952 Long term (current) use of systemic steroids: Secondary | ICD-10-CM | POA: Insufficient documentation

## 2022-07-04 DIAGNOSIS — I2510 Atherosclerotic heart disease of native coronary artery without angina pectoris: Secondary | ICD-10-CM | POA: Insufficient documentation

## 2022-07-04 DIAGNOSIS — Z7982 Long term (current) use of aspirin: Secondary | ICD-10-CM | POA: Insufficient documentation

## 2022-07-04 NOTE — Progress Notes (Signed)
Nutrition Assessment   Reason for Assessment: MST screen for weight loss.    ASSESSMENT:  Patient is 86 year old with immune thrombocytopenic purpura worsening dementia and PMHx of CHF.  Spoke with patient's spouse who reported he decided to go with [facility] palliative care because he was told [facility] wouldn't cover her platelet treatments.  Patient has also been to ER with after a few falls and currently has a wound on head from a fall this weekend. He reports patient has no appetite and it can be a struggle to get her to eat and she has early satiety.    Spouse who reports he's been using the Equate drinks to increase calories. He's been using some convenience items but worries about the sodium content with her CHF Cereal Chicken salad with cranberries Mixed greens and kale salad  Snack bars to "desserts"   Anthropometrics:   Height: 65" Weight:  06/29/22  120# 06/01/22  124# 05/04/22   130#  INTERVENTION: Goal is comfort. Encouraged adequate calorie and protein energy intake  with nutrient dense food when possible to help her wound heal and attempt weight maintenance. Encourage spouse to give himself some grace and know that with end of life and dementia sometimes weight loss continues through no fault of his efforts. Encouraged healthy fats and added fats to promote calorie increase with food that have longer shelf life since he has difficulty getting out to shop often (consider PB&J). Discussed ways to add calories/protein to foods (adding cheese, cooking with butter, creamy sauces/gravy) Encouraged soft moist high protein foods as well as small frequent meals/snacks -  Emailed Nutrition Tip sheet  for  high calorie snacking and recipe for high protein chocolate pudding (likes [employer]ese[employer] cups).  Mailed Ensure recipe book with coupons. Contact information provided.  Visit: PRN at patient or provider request  Gennaro Africa, RDN, LDN Registered Dietitian, [facility] Part Time Remote (Usual office hours: Tuesday-Thursday) Cell: [phone]

## 2022-07-05 ENCOUNTER — Inpatient Hospital Stay: Payer: Medicare Other

## 2022-07-05 ENCOUNTER — Inpatient Hospital Stay: Payer: Medicare Other | Admitting: Hematology and Oncology

## 2022-07-05 ENCOUNTER — Encounter: Payer: Self-pay | Admitting: Hematology and Oncology

## 2022-07-05 VITALS — BP 128/51 | HR 75 | Temp 98.2°F | Resp 16

## 2022-07-05 DIAGNOSIS — M810 Age-related osteoporosis without current pathological fracture: Secondary | ICD-10-CM | POA: Diagnosis not present

## 2022-07-05 DIAGNOSIS — D693 Immune thrombocytopenic purpura: Secondary | ICD-10-CM | POA: Diagnosis present

## 2022-07-05 DIAGNOSIS — J90 Pleural effusion, not elsewhere classified: Secondary | ICD-10-CM | POA: Diagnosis not present

## 2022-07-05 DIAGNOSIS — K219 Gastro-esophageal reflux disease without esophagitis: Secondary | ICD-10-CM | POA: Diagnosis not present

## 2022-07-05 DIAGNOSIS — Z7952 Long term (current) use of systemic steroids: Secondary | ICD-10-CM | POA: Diagnosis not present

## 2022-07-05 DIAGNOSIS — E538 Deficiency of other specified B group vitamins: Secondary | ICD-10-CM | POA: Diagnosis not present

## 2022-07-05 DIAGNOSIS — Z9181 History of falling: Secondary | ICD-10-CM | POA: Diagnosis not present

## 2022-07-05 DIAGNOSIS — E785 Hyperlipidemia, unspecified: Secondary | ICD-10-CM | POA: Diagnosis not present

## 2022-07-05 DIAGNOSIS — I252 Old myocardial infarction: Secondary | ICD-10-CM | POA: Diagnosis not present

## 2022-07-05 DIAGNOSIS — J811 Chronic pulmonary edema: Secondary | ICD-10-CM | POA: Diagnosis not present

## 2022-07-05 DIAGNOSIS — I2510 Atherosclerotic heart disease of native coronary artery without angina pectoris: Secondary | ICD-10-CM | POA: Diagnosis not present

## 2022-07-05 DIAGNOSIS — Z8673 Personal history of transient ischemic attack (TIA), and cerebral infarction without residual deficits: Secondary | ICD-10-CM | POA: Diagnosis not present

## 2022-07-05 DIAGNOSIS — F0390 Unspecified dementia without behavioral disturbance: Secondary | ICD-10-CM | POA: Diagnosis not present

## 2022-07-05 DIAGNOSIS — Z79899 Other long term (current) drug therapy: Secondary | ICD-10-CM | POA: Diagnosis not present

## 2022-07-05 DIAGNOSIS — Z7982 Long term (current) use of aspirin: Secondary | ICD-10-CM | POA: Diagnosis not present

## 2022-07-05 DIAGNOSIS — Z7902 Long term (current) use of antithrombotics/antiplatelets: Secondary | ICD-10-CM | POA: Diagnosis not present

## 2022-07-05 MED ORDER — ROMIPLOSTIM 125 MCG ~~LOC~~ SOLR
2.0000 ug/kg | Freq: Once | SUBCUTANEOUS | Status: AC
  Administered 2022-07-05: 110 ug via SUBCUTANEOUS
  Filled 2022-07-05: qty 0.22

## 2022-07-05 NOTE — Progress Notes (Signed)
Patient has periorbital bruising, deep purple. Large bruise noted to neck- Husband states that she fell on Sunday.

## 2022-07-05 NOTE — Patient Instructions (Signed)
Romiplostim injection ?What is this medication? ?ROMIPLOSTIM (roe mi PLOE stim) helps your body make more platelets. This medicine is used to treat low platelets caused by chronic idiopathic thrombocytopenic purpura (ITP) or a bone marrow syndrome caused by radiation sickness. ?This medicine may be used for other purposes; ask your health care provider or pharmacist if you have questions. ?COMMON BRAND NAME(S): Nplate ?What should I tell my care team before I take this medication? ?They need to know if you have any of these conditions: ?blood clots ?myelodysplastic syndrome ?an unusual or allergic reaction to romiplostim, mannitol, other medicines, foods, dyes, or preservatives ?pregnant or trying to get pregnant ?breast-feeding ?How should I use this medication? ?This medicine is injected under the skin. It is given by a health care provider in a hospital or clinic setting. ?A special MedGuide will be given to you before each treatment. Be sure to read this information carefully each time. ?Talk to your health care provider about the use of this medicine in children. While it may be prescribed for children as young as newborns for selected conditions, precautions do apply. ?Overdosage: If you think you have taken too much of this medicine contact a poison control center or emergency room at once. ?NOTE: This medicine is only for you. Do not share this medicine with others. ?What if I miss a dose? ?Keep appointments for follow-up doses. It is important not to miss your dose. Call your health care provider if you are unable to keep an appointment. ?What may interact with this medication? ?Interactions are not expected. ?This list may not describe all possible interactions. Give your health care provider a list of all the medicines, herbs, non-prescription drugs, or dietary supplements you use. Also tell them if you smoke, drink alcohol, or use illegal drugs. Some items may interact with your medicine. ?What should I  watch for while using this medication? ?Visit your health care provider for regular checks on your progress. You may need blood work done while you are taking this medicine. Your condition will be monitored carefully while you are receiving this medicine. It is important not to miss any appointments. ?What side effects may I notice from receiving this medication? ?Side effects that you should report to your doctor or health care professional as soon as possible: ?allergic reactions (skin rash, itching or hives; swelling of the face, lips, or tongue) ?bleeding (bloody or black, tarry stools; red or dark brown urine; spitting up blood or brown material that looks like coffee grounds; red spots on the skin; unusual bruising or bleeding from the eyes, gums, or nose) ?blood clot (chest pain; shortness of breath; pain, swelling, or warmth in the leg) ?stroke (changes in vision; confusion; trouble speaking or understanding; severe headaches; sudden numbness or weakness of the face, arm or leg; trouble walking; dizziness; loss of balance or coordination) ?Side effects that usually do not require medical attention (report to your doctor or health care professional if they continue or are bothersome): ?diarrhea ?dizziness ?headache ?joint pain ?muscle pain ?stomach pain ?trouble sleeping ?This list may not describe all possible side effects. Call your doctor for medical advice about side effects. You may report side effects to FDA at 1-800-FDA-1088. ?Where should I keep my medication? ?This medicine is given in a hospital or clinic. It will not be stored at home. ?NOTE: This sheet is a summary. It may not cover all possible information. If you have questions about this medicine, talk to your doctor, pharmacist, or health care provider. ??   2023 Elsevier/Gold Standard (2021-10-21 00:00:00) ? ?

## 2022-07-06 ENCOUNTER — Encounter: Payer: Self-pay | Admitting: Hematology and Oncology

## 2022-07-06 ENCOUNTER — Encounter: Payer: Self-pay | Admitting: Oncology

## 2022-07-06 LAB — CBC AND DIFFERENTIAL
HCT: 28 — AB (ref 36–46)
Hemoglobin: 9.1 — AB (ref 12.0–16.0)
Neutrophils Absolute: 3.43
Platelets: 17 10*3/uL — AB (ref 150–400)
WBC: 4.7

## 2022-07-06 LAB — CBC: RBC: 3.23 — AB (ref 3.87–5.11)

## 2022-07-06 NOTE — Assessment & Plan Note (Signed)
Thrombocytopenia,felt to be most consistent with immune thrombocytopenic purpura.We have treated her with pulsed dexamethasone due to concern over tolerance of high-dose steroids for any length of time due to dementia and agitation. She had trouble tolerating decreased dose for 5 days, so we reduced this to 3 days when she was treated on June 29. Unfortunately, she did not have animmediate response and was hospitalized with worsening thrombocytopenia, in addition to hypotension and dehydration.Unfortunately she has had a decline in her general health and continues to lose weight. We feel this is due to her dementia. When she was hospitalized she had a hospice consult and we encouraged her husband to proceed with that. Today, platelets are 17. She was 20 three days ago at discharge from the hospital. She will receive her Nplate injection today. We will hold off on transfusion right now. Her husband states that they have an appointment with Palliative Care tomorrow. We will plan to see her back in one week for repeat evaluation.

## 2022-07-06 NOTE — Progress Notes (Signed)
Patient Care Team: Gordan Payment., MD as PCP - General (Internal Medicine) Runell Gess, MD as PCP - Cardiology (Cardiology) Georgeanna Lea, MD as Consulting Physician (Cardiology) Dellia Beckwith, MD as Consulting Physician (Oncology)  Clinic Day:  07/06/2022  Referring physician: Gordan Payment., MD  ASSESSMENT & PLAN:   Assessment & Plan: Immune thrombocytopenic purpura (HCC) Thrombocytopenia, felt to be most consistent with immune thrombocytopenic purpura.  We have treated her with pulsed dexamethasone due to concern over tolerance of high-dose steroids for any length of time due to dementia and agitation.  She had trouble tolerating decreased dose for 5 days, so we reduced this to 3 days when she was treated on June 29.  Unfortunately, she did not have an immediate response and was hospitalized with worsening thrombocytopenia, in addition to hypotension and dehydration. Unfortunately she has had a decline in her general health and continues to lose weight.  We feel this is due to her dementia.  When she was hospitalized she had a hospice consult and we encouraged her husband to proceed with that. Today, platelets are 17. She was 20 three days ago at discharge from the hospital. She will receive her Nplate injection today. We will hold off on transfusion right now. Her husband states that they have an appointment with Palliative Care tomorrow. We will plan to see her back in one week for repeat evaluation.    The patient understands the plans discussed today and is in agreement with them.  She knows to contact our office if she develops concerns prior to her next appointment.    Pascal Lux, NP  [facility] [address] [place] [address] Dept: [phone] Dept Fax: [phone]   Orders Placed This Encounter  Procedures   CBC and differential    This external order was created through the  Results Console.   CBC    This external order was created through the Results Console.      CHIEF COMPLAINT:  CC: An 86 year old female with history of ITP here for weekly evaluation  Current Treatment:  Nplate  INTERVAL HISTORY:  Maria Holloway is here today for repeat clinical assessment. She denies fevers or chills. She denies pain. Her appetite is good. Her weight has been stable.  I have reviewed the past medical history, past surgical history, social history and family history with the patient and they are unchanged from previous note.  ALLERGIES:  has No Known Allergies.  MEDICATIONS:  Current Outpatient Medications  Medication Sig Dispense Refill   carvedilol (COREG) 6.25 MG tablet Take 6.25 mg by mouth 2 (two) times daily.     cyanocobalamin 1000 MCG tablet Take 1,000 mcg by mouth daily.     dexamethasone (DECADRON) 4 MG tablet Take 1 tablet (4 mg total) by mouth 3 (three) times daily. 9 tablet 1   donepezil (ARICEPT) 5 MG tablet Take 5 mg by mouth daily.     Folic Acid (FOLATE PO)      furosemide (LASIX) 20 MG tablet Take 20 mg by mouth as needed for fluid or edema.     haloperidol (HALDOL) 1 MG tablet TAKE 1 TABLET (1 MG TOTAL) BY MOUTH EVERY 8 (EIGHT) HOURS AS NEEDED FOR AGITATION. 60 tablet 1   LORazepam (ATIVAN) 1 MG tablet Take 1 tablet (1 mg total) by mouth every 8 (eight) hours. 30 tablet 0   losartan (COZAAR) 25 MG tablet Take 1 tablet  by mouth daily.     methocarbamol (ROBAXIN) 500 MG tablet Take 500 mg by mouth.     metoprolol tartrate (LOPRESSOR) 25 MG tablet Take 1 tablet (25 mg total) by mouth 2 (two) times daily. 180 tablet 1   nitroGLYCERIN (NITROSTAT) 0.4 MG SL tablet Place 1 tablet (0.4 mg total) under the tongue every 5 (five) minutes x 3 doses as needed for chest pain. 25 tablet 3   pantoprazole (PROTONIX) 40 MG tablet Take 1 tablet (40 mg total) by mouth daily. 90 tablet 1   sertraline (ZOLOFT) 50 MG tablet Take 50 mg by mouth daily.     No current  facility-administered medications for this visit.    HISTORY OF PRESENT ILLNESS:   Oncology History   No history exists.      REVIEW OF SYSTEMS:   Constitutional: Denies fevers, chills or abnormal weight loss Eyes: Denies blurriness of vision Ears, nose, mouth, throat, and face: Denies mucositis or sore throat Respiratory: Denies cough, dyspnea or wheezes Cardiovascular: Denies palpitation, chest discomfort or lower extremity swelling Gastrointestinal:  Denies nausea, heartburn or change in bowel habits Skin: Denies abnormal skin rashes Lymphatics: Denies new lymphadenopathy or easy bruising Neurological:Denies numbness, tingling or new weaknesses Behavioral/Psych: Mood is stable, no new changes  All other systems were reviewed with the patient and are negative.   VITALS:  Blood pressure (!) 125/58, pulse 82, temperature 98 F (36.7 C), temperature source Oral, resp. rate 16, height 5\' 5"  (1.651 m), weight 120 lb (54.4 kg), SpO2 96 %.  Wt Readings from Last 3 Encounters:  07/05/22 120 lb (54.4 kg)  06/29/22 120 lb 4.8 oz (54.6 kg)  06/26/22 119 lb (54 kg)    Body mass index is 19.97 kg/m.  Performance status (ECOG): 1 - Symptomatic but completely ambulatory  PHYSICAL EXAM:   GENERAL:alert, no distress and comfortable SKIN: skin color, texture, turgor are normal, no rashes or significant lesions EYES: normal, Conjunctiva are pink and non-injected, sclera clear OROPHARYNX:no exudate, no erythema and lips, buccal mucosa, and tongue normal  NECK: supple, thyroid normal size, non-tender, without nodularity LYMPH:  no palpable lymphadenopathy in the cervical, axillary or inguinal LUNGS: clear to auscultation and percussion with normal breathing effort HEART: regular rate & rhythm and no murmurs and no lower extremity edema ABDOMEN:abdomen soft, non-tender and normal bowel sounds Musculoskeletal:no cyanosis of digits and no clubbing  NEURO: alert & oriented x 3 with fluent  speech, no focal motor/sensory deficits  LABORATORY DATA:  I have reviewed the data as listed    Component Value Date/Time   NA 136 (A) 06/22/2022 0000   K 4.1 06/22/2022 0000   CL 104 06/22/2022 0000   CO2 26 (A) 06/22/2022 0000   GLUCOSE 113 (H) 04/17/2022 1218   GLUCOSE 114 (H) 03/12/2022 0337   BUN 13 06/22/2022 0000   CREATININE 0.7 06/22/2022 0000   CREATININE 0.97 04/17/2022 1218   CALCIUM 8.9 06/22/2022 0000   PROT 5.8 (A) 04/27/2022 0000   ALBUMIN 3.5 06/22/2022 0000   AST 21 06/22/2022 0000   ALT 18 06/22/2022 0000   ALKPHOS 57 06/22/2022 0000   BILITOT 1.1 03/09/2022 0655   GFRNONAA >60 03/12/2022 0337    No results found for: "SPEP", "UPEP"  Lab Results  Component Value Date   WBC 4.7 07/06/2022   NEUTROABS 3.43 07/06/2022   HGB 9.1 (A) 07/06/2022   HCT 28 (A) 07/06/2022   MCV 88 05/18/2022   PLT 17 (A) 07/06/2022  Chemistry      Component Value Date/Time   NA 136 (A) 06/22/2022 0000   K 4.1 06/22/2022 0000   CL 104 06/22/2022 0000   CO2 26 (A) 06/22/2022 0000   BUN 13 06/22/2022 0000   CREATININE 0.7 06/22/2022 0000   CREATININE 0.97 04/17/2022 1218   GLU 107 06/22/2022 0000      Component Value Date/Time   CALCIUM 8.9 06/22/2022 0000   ALKPHOS 57 06/22/2022 0000   AST 21 06/22/2022 0000   ALT 18 06/22/2022 0000   BILITOT 1.1 03/09/2022 0655       RADIOGRAPHIC STUDIES: I have personally reviewed the radiological images as listed and agreed with the findings in the report. No results found.

## 2022-07-11 ENCOUNTER — Other Ambulatory Visit: Payer: Self-pay | Admitting: Pharmacist

## 2022-07-12 ENCOUNTER — Encounter: Payer: Self-pay | Admitting: Hematology and Oncology

## 2022-07-12 ENCOUNTER — Inpatient Hospital Stay (INDEPENDENT_AMBULATORY_CARE_PROVIDER_SITE_OTHER): Payer: Medicare Other | Admitting: Hematology and Oncology

## 2022-07-12 ENCOUNTER — Inpatient Hospital Stay: Payer: Medicare Other

## 2022-07-12 VITALS — BP 132/67 | HR 68 | Temp 98.1°F | Resp 18 | Ht 65.0 in | Wt 120.0 lb

## 2022-07-12 DIAGNOSIS — D693 Immune thrombocytopenic purpura: Secondary | ICD-10-CM

## 2022-07-12 LAB — BASIC METABOLIC PANEL
BUN: 14 (ref 4–21)
CO2: 25 — AB (ref 13–22)
Chloride: 104 (ref 99–108)
Creatinine: 0.5 (ref 0.5–1.1)
Glucose: 195
Potassium: 3.6 mEq/L (ref 3.5–5.1)
Sodium: 135 — AB (ref 137–147)

## 2022-07-12 LAB — CBC AND DIFFERENTIAL
HCT: 31 — AB (ref 36–46)
Hemoglobin: 10.3 — AB (ref 12.0–16.0)
Neutrophils Absolute: 3.24
Platelets: 54 10*3/uL — AB (ref 150–400)
WBC: 4.7

## 2022-07-12 LAB — CBC: RBC: 3.55 — AB (ref 3.87–5.11)

## 2022-07-12 LAB — HEPATIC FUNCTION PANEL
ALT: 17 U/L (ref 7–35)
AST: 27 (ref 13–35)
Alkaline Phosphatase: 140 — AB (ref 25–125)
Bilirubin, Total: 1.4

## 2022-07-12 LAB — COMPREHENSIVE METABOLIC PANEL
Albumin: 3.6 (ref 3.5–5.0)
Calcium: 8.5 — AB (ref 8.7–10.7)

## 2022-07-12 MED ORDER — ROMIPLOSTIM 250 MCG ~~LOC~~ SOLR
3.0000 ug/kg | Freq: Once | SUBCUTANEOUS | Status: AC
  Administered 2022-07-12: 165 ug via SUBCUTANEOUS
  Filled 2022-07-12: qty 0.33

## 2022-07-12 NOTE — Assessment & Plan Note (Signed)
Persistent thrombocytopenia despite pulse steroid dosing.  She does not tolerate steroid well.  Due to the severity of the thrombocytopenia, she had been getting frequent platelet transfusions.  She was therefore started on Nplate on July 27th and has good improvement in her thrombocytopenia.  We will continue Nplate weekly.  We will plan to see her back in 1 week as scheduled.

## 2022-07-12 NOTE — Progress Notes (Signed)
Day  [address] 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  She does not tolerate steroid well.  Due to the severity of the thrombocytopenia, she had been getting frequent platelet transfusions.  She was therefore started on Nplate on July 075-GRM and has good improvement in her thrombocytopenia.  We will continue Nplate weekly.  We will plan to see her back in 1 week as scheduled.    The patient understands the plans discussed today and is in agreement with them.  She knows to contact our office if she develops concerns prior to her next appointment.   I provided 15 minutes of face-to-face time during this encounter and > 50% was spent counseling as documented under my assessment and plan.    Marvia Pickles, PA-C  [facility] [address] [place] [address] Dept: [phone] Dept Fax: [phone]   Orders Placed This Encounter  Procedures   CBC and differential    This external order was created through the Results Console.   CBC    This external order was created through the Results Console.   Basic metabolic panel    This external order was created through the Results Console.   Comprehensive metabolic panel    This external order was created through the Results Console.   Hepatic function panel    This external order was created through the Results Console.      CHIEF COMPLAINT:  CC: Immune thrombocytopenic purpura  Current Treatment: Nplate weekly  HISTORY OF PRESENT ILLNESS:  Maria Holloway is a 86 y.o. female with dementia with immune thrombocytopenic purpura.  We began seeing her on May 12th, at which time her platelet count was 31,000.  She has a  history of CVA in February, for which she was on aspirin 81 mg daily.  On April 6th, she presented to [facility] ER after a fall with significant bruising of her face.  She had mild thrombocytopenia with a platelet count of 119,000.  White blood count and hemoglobin were normal.  CT head did not reveal any intracranial injury.  There was a forehead hematoma without calvarial fracture. Chronic small vessel disease with infarcts was seen.  EKG revealed ST elevation consistent with an acute myocardial infarction.  She was transferred to [facility] and underwent cardiac catheterization with stenting of the right coronary artery.  Her folate level was mildly low at 5.6.  She was discharged home on aspirin 81 mg daily and Brilinta 90 mg twice daily.  She was seen at the [facility] again on April 12th with general weakness and her platelet count remained low at 117,000.  Her hemoglobin was mildly low at 11.7 and bilirubin was 2.  She presented to the ER again on April 15th with restlessness and anxiety, which she had been experiencing since her hospitalization for MRI.  She had persistent thrombocytopenia with a platelet count of 117,000, but normal white count and hemoglobin. The bilirubin was 1.5.  No other specific concerns were found.  She presented to the [facility] again on April 28th with general weakness and dizziness. Her CT head did not reveal any acute abnormality.  CTA chest did not reveal acute pulmonary embolism.  There were multiple  pulmonary nodules the largest measuring 2.8 cm.  Repeat CT or PET scan was recommended.  Small bilateral pleural effusions with mild interstitial edema was seen.  She also had elevation of her BNP.  She was admitted and diuresed.  Echocardiogram revealed mild concentric left ventricular hypertrophy with overall normal left ventricular function with an ejection fraction of 55 to 60%.  She had persistent thrombocytopenia with a platelet count of 66,000 to 79,00  during her 3-day admission.  Her hemoglobin fluctuated up and down but remained above 11.  White blood count remained normal and bilirubin was normal.  She saw Dr. Bea Graff for follow-up on May 8th at which time her platelet count had dropped to 29,000, so she was referred here.  White blood cells, hemoglobin and bilirubin remained normal.  Dr. Gwenlyn Found switched from [facility] to Plavix due to thrombocytopenia on May 13th.   Evaluation in our office revealed persistent mild folate deficiency with folic acid of 5.2.  B12 was normal.  There was no evidence of hemolysis.  Serum protein electrophoresis did not reveal a monoclonal protein.  We felt this was most consistent with immune thrombocytopenic purpura.  She was treated with pulse dexamethasone 12 mg daily for 5 days, which is a reduced dose, as we were concerned higher doses or treatment for an extended period of time would worsen her dementia and agitation.  She was also given haloperidol 1 mg every 8 hours to use as needed for agitation.  She tolerated dexamethasone better than expected, but did require Haldol fairly regularly.  Initially, her platelet count went up to 69,000, but since that time, it had steadily decreased.  As her platelet count remained above 20,000, we opted for observation due to difficulty tolerating the dexamethasone.  She also has had mild anemia.  She had been steadily losing weight.   Her platelets dropped down to 23,000 on June 29, so she was given another pulse dexamethasone at a reduced dose of 4 mg 3 times daily for 3 days due to previous poor tolerance.  Her husband was unable to bring her for her appointment the following week.  She presented to the emergency room on July 10 with near syncope, hypotension and dehydration.  Her platelet count was 14,000 despite the pulse of dexamethasone.  She was admitted for further management of her hypotension and dehydration.  She received 1 platelet pheresis and started on dexamethasone 20 mg  daily.  She and her husband insisted on discharge later that day, so she was sent out on oral dexamethasone.  We arranged for her to have a platelet transfusion as an outpatient on July 12.  Her platelet count was up to 55,000 on July 13 after transfusion.  On July 20, the platelet count had dropped to 11,000, so she had another transfusion.  She had a fall in between visits and had bruising of her face.  On July 27, her platelet count remained 11,000, so she was transfused again.  Due to the persistent thrombocytopenia, she was started on Nplate injections on July 27.  On August 2, her platelet count was slightly better 17,000 and we held off on transfusion.  INTERVAL HISTORY:  Maria Holloway is here today for repeat clinical assessment and is more animated today.  She denies any overt formal blood loss.  She has not had further falls, but has persistent ecchymosis of the face from previous fall.  She denies fevers or chills. She denies pain. Her appetite is fairly good, and she is  drinking nutritional supplements at least 2 daily, as well as adding cream to the supplements and to the milk on her cereal. Her weight has been stable.  Her aspirin and Plavix have been on hold due to severe thrombocytopenia.  Her husband was concerned that her atorvastatin may be contributing to the thrombocytopenia, but it is unlikely that it would cause the severity we are seeing.  He has been holding her atorvastatin.  He states Dr. Bea Graff gave him permission to decrease the dose in half, so he will resume that tomorrow.  REVIEW OF SYSTEMS:  Review of Systems  Constitutional:  Negative for appetite change, chills, fatigue, fever and unexpected weight change.  HENT:   Negative for lump/mass, mouth sores and sore throat.   Respiratory:  Negative for cough and shortness of breath.   Cardiovascular:  Negative for chest pain and leg swelling.  Gastrointestinal:  Negative for abdominal pain, constipation, diarrhea, nausea and  vomiting.  Endocrine: Negative for hot flashes.  Genitourinary:  Negative for difficulty urinating, dysuria, frequency and hematuria.   Musculoskeletal:  Negative for arthralgias, back pain and myalgias.  Skin:  Negative for rash.  Neurological:  Negative for dizziness and headaches.  Hematological:  Negative for adenopathy. Does not bruise/bleed easily.  Psychiatric/Behavioral:  Negative for depression and sleep disturbance. The patient is not nervous/anxious.      VITALS:  Blood pressure (!) 142/69, pulse 80, temperature (!) 97.4 F (36.3 C), temperature source Oral, resp. rate 18, height 5\' 5"  (1.651 m), weight 120 lb 9.6 oz (54.7 kg), SpO2 96 %.  Wt Readings from Last 3 Encounters:  07/12/22 120 lb (54.4 kg)  07/12/22 120 lb 9.6 oz (54.7 kg)  07/05/22 120 lb (54.4 kg)    Body mass index is 20.07 kg/m.  Performance status (ECOG): 0 - Asymptomatic  PHYSICAL EXAM:  Physical Exam Vitals and nursing note reviewed.  Constitutional:      General: She is not in acute distress.    Appearance: Normal appearance.  HENT:     Head: Normocephalic and atraumatic.     Comments: Persistent ecchymosis of most of the right side of her face and the upper part of the left side of her face    Mouth/Throat:     Mouth: Mucous membranes are moist.     Pharynx: Oropharynx is clear. No oropharyngeal exudate or posterior oropharyngeal erythema.  Eyes:     General: No scleral icterus.    Extraocular Movements: Extraocular movements intact.     Conjunctiva/sclera: Conjunctivae normal.     Pupils: Pupils are equal, round, and reactive to light.  Cardiovascular:     Rate and Rhythm: Normal rate and regular rhythm.     Heart sounds: Normal heart sounds. No murmur heard.    No friction rub. No gallop.  Pulmonary:     Effort: Pulmonary effort is normal.     Breath sounds: Normal breath sounds. No wheezing, rhonchi or rales.  Abdominal:     General: There is no distension.     Palpations: Abdomen is  soft. There is no hepatomegaly, splenomegaly or mass.     Tenderness: There is no abdominal tenderness.  Musculoskeletal:        General: Normal range of motion.     Cervical back: Normal range of motion and neck supple. No tenderness.     Right lower leg: No edema.     Left lower leg: No edema.  Lymphadenopathy:     Cervical: No cervical adenopathy.  Upper Body:     Right upper body: No supraclavicular or axillary adenopathy.     Left upper body: No supraclavicular or axillary adenopathy.  Skin:    General: Skin is warm and dry.     Coloration: Skin is not jaundiced.     Findings: No rash.  Neurological:     Mental Status: She is alert and oriented to person, place, and time.     Cranial Nerves: No cranial nerve deficit.  Psychiatric:        Mood and Affect: Mood normal.        Behavior: Behavior normal.        Thought Content: Thought content normal.     LABS:      Latest Ref Rng & Units 07/12/2022   12:00 AM 07/06/2022   12:00 AM 06/29/2022   12:00 AM  CBC  WBC  4.7     4.7     3.4      Hemoglobin 12.0 - 16.0 10.3     9.1     10.3      Hematocrit 36 - 46 31     28     31       Platelets 150 - 400 K/uL 54     17     11         This result is from an external source.      Latest Ref Rng & Units 07/12/2022   12:00 AM 06/22/2022   12:00 AM 06/15/2022   12:00 AM  CMP  BUN 4 - 21 14     13     17       Creatinine 0.5 - 1.1 0.5     0.7     0.8      Sodium 137 - 147 135     136     137      Potassium 3.5 - 5.1 mEq/L 3.6     4.1     3.6      Chloride 99 - 108 104     104     101      CO2 13 - 22 25     26     31       Calcium 8.7 - 10.7 8.5     8.9     9.1      Alkaline Phos 25 - 125 140     57     52      AST 13 - 35 27     21     28       ALT 7 - 35 U/L 17     18     25          This result is from an external source.     No results found for: "CEA1", "CEA" / No results found for: "CEA1", "CEA" No results found for: "PSA1" No results found for: "[id number]" No results  found for: "CAN125"  Lab Results  Component Value Date   TOTALPROTELP 6.0 04/14/2022   ALBUMINELP 3.5 04/14/2022   A1GS 0.3 04/14/2022   A2GS 0.5 04/14/2022   BETS 0.7 04/14/2022   GAMS 1.0 04/14/2022   MSPIKE Not Observed 04/14/2022   SPEI Comment 04/14/2022   No results found for: "TIBC", "FERRITIN", "IRONPCTSAT" Lab Results  Component Value Date   LDH 189 04/14/2022    STUDIES:  No results found.    HISTORY:   Past Medical History:  Diagnosis  Date   Acute ST elevation myocardial infarction (STEMI) of inferior wall (HCC) 03/09/2022   Age-related osteoporosis without current pathological fracture 04/20/2021   Formatting of this note might be different from the original. Dexa 04/2021. [facility]   Anxiety disorder 03/23/2021   Formatting of this note might be different from the original. Tried and failed escitalopram   At high risk for falls 01/23/2022   CAD (coronary artery disease)    a. s/p STEMI in 03/2022 with DES to prox/mid RCA.   Cerebral atherosclerosis    Delirium superimposed on dementia 03/10/2022   Essential hypertension    Fall 03/09/2022   Folate deficiency 04/27/2022   GERD without esophagitis 08/18/2021   History of arterial ischemic stroke 01/23/2022   Formatting of this note might be different from the original. 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/2021   Nocturia 09/26/2021   Osteoporosis    Primary osteoarthritis involving multiple joints 08/18/2021   Seborrheic keratosis 09/26/2021   Stroke (HCC)    Thrombocytopenia (HCC)    Vitamin B12 deficiency     Past Surgical History:  Procedure Laterality Date   CORONARY/GRAFT ACUTE MI REVASCULARIZATION N/A 03/09/2022   Procedure: Coronary/Graft Acute MI Revascularization;  Surgeon: Runell Gess, MD;  Location: [facility] INVASIVE CV LAB;  Service: Cardiovascular;  Laterality: N/A;   LEFT  HEART CATH AND CORONARY ANGIOGRAPHY N/A 03/09/2022   Procedure: LEFT HEART CATH AND CORONARY ANGIOGRAPHY;  Surgeon: Runell Gess, MD;  Location: [facility] INVASIVE CV LAB;  Service: Cardiovascular;  Laterality: N/A;    Family History  Problem Relation Age of Onset   Stroke Mother    CAD Father    Heart attack Father     Social History:  reports that she has never smoked. She has never used smokeless tobacco. She reports current alcohol use. She reports that she does not use drugs.The patient is accompanied by her husband today.  Allergies: No Known Allergies  Current Medications: Current Outpatient Medications  Medication Sig Dispense Refill   carvedilol (COREG) 6.25 MG tablet Take 6.25 mg by mouth 2 (two) times daily.     cyanocobalamin 1000 MCG tablet Take 1,000 mcg by mouth daily.     dexamethasone (DECADRON) 4 MG tablet Take 1 tablet (4 mg total) by mouth 3 (three) times daily. 9 tablet 1   donepezil (ARICEPT) 5 MG tablet Take 5 mg by mouth daily.     Folic Acid (FOLATE PO)      furosemide (LASIX) 20 MG tablet Take 20 mg by mouth as needed for fluid or edema.     haloperidol (HALDOL) 1 MG tablet TAKE 1 TABLET (1 MG TOTAL) BY MOUTH EVERY 8 (EIGHT) HOURS AS NEEDED FOR AGITATION. 60 tablet 1   LORazepam (ATIVAN) 1 MG tablet Take 1 tablet (1 mg total) by mouth every 8 (eight) hours. 30 tablet 0   losartan (COZAAR) 25 MG tablet Take 1 tablet by mouth daily.     methocarbamol (ROBAXIN) 500 MG tablet Take 500 mg by mouth.     metoprolol tartrate (LOPRESSOR) 25 MG tablet Take 1 tablet (25 mg total) by mouth 2 (two) times daily. 180 tablet 1   nitroGLYCERIN (NITROSTAT) 0.4 MG SL tablet Place 1 tablet (0.4 mg total) under the tongue every 5 (five) minutes x 3 doses as needed for chest pain. 25 tablet 3   pantoprazole (PROTONIX)  40 MG tablet Take 1 tablet (40 mg total) by mouth daily. 90 tablet 1   sertraline (ZOLOFT) 50 MG tablet Take 50 mg by mouth daily.     No current facility-administered  medications for this visit.

## 2022-07-12 NOTE — Patient Instructions (Signed)
Romiplostim Injection What is this medication? ROMIPLOSTIM (roe mi PLOE stim) treats low levels of platelets in your body caused by immune thrombocytopenia (ITP). It is prescribed when other medications have not worked or cannot be tolerated. It may also be used to help people who have been exposed to high doses of radiation. It works by increasing the amount of platelets in your blood. This lowers the risk of bleeding. This medicine may be used for other purposes; ask your health care provider or pharmacist if you have questions. COMMON BRAND NAME(S): Nplate What should I tell my care team before I take this medication? They need to know if you have any of these conditions: Blood clots Myelodysplastic syndrome An unusual or allergic reaction to romiplostim, mannitol, other medications, foods, dyes, or preservatives Pregnant or trying to get pregnant Breast-feeding How should I use this medication? This medication is injected under the skin. It is given by a care team in a hospital or clinic setting. A special MedGuide will be given to you before each treatment. Be sure to read this information carefully each time. Talk to your care team about the use of this medication in children. While it may be prescribed for children as young as newborns for selected conditions, precautions do apply. Overdosage: If you think you have taken too much of this medicine contact a poison control center or emergency room at once. NOTE: This medicine is only for you. Do not share this medicine with others. What if I miss a dose? Keep appointments for follow-up doses. It is important not to miss your dose. Call your care team if you are unable to keep an appointment. What may interact with this medication? Interactions are not expected. This list may not describe all possible interactions. Give your health care provider a list of all the medicines, herbs, non-prescription drugs, or dietary supplements you use. Also  tell them if you smoke, drink alcohol, or use illegal drugs. Some items may interact with your medicine. What should I watch for while using this medication? Visit your care team for regular checks on your progress. You may need blood work done while you are taking this medication. Your condition will be monitored carefully while you are receiving this medication. It is important not to miss any appointments. What side effects may I notice from receiving this medication? Side effects that you should report to your care team as soon as possible: Allergic reactions--skin rash, itching, hives, swelling of the face, lips, tongue, or throat Blood clot--pain, swelling, or warmth in the leg, shortness of breath, chest pain Side effects that usually do not require medical attention (report to your care team if they continue or are bothersome): Dizziness Joint pain Muscle pain Pain in the hands or feet Stomach pain Trouble sleeping This list may not describe all possible side effects. Call your doctor for medical advice about side effects. You may report side effects to FDA at 1-800-FDA-1088. Where should I keep my medication? This medication is given in a hospital or clinic. It will not be stored at home. NOTE: This sheet is a summary. It may not cover all possible information. If you have questions about this medicine, talk to your doctor, pharmacist, or health care provider.  2023 Elsevier/Gold Standard (2022-02-28 00:00:00)  

## 2022-07-17 ENCOUNTER — Telehealth: Payer: Self-pay

## 2022-07-17 NOTE — Telephone Encounter (Signed)
Shylah Dossantos,RN: I called Hannah back and gave her the message. She will call me back if there is a problem.  Melissa,NP: They can break  them in half if they are not too small; otherwise I will send in the 0.5  Received message from [facility], w/ [facility]. She is reporting that pt's spouse would like to change the dosage of Ativan 1mg . He states it makes her too sedated.

## 2022-07-18 ENCOUNTER — Other Ambulatory Visit: Payer: Self-pay | Admitting: Hematology and Oncology

## 2022-07-18 DIAGNOSIS — D693 Immune thrombocytopenic purpura: Secondary | ICD-10-CM

## 2022-07-19 ENCOUNTER — Inpatient Hospital Stay: Payer: Medicare Other

## 2022-07-19 ENCOUNTER — Encounter: Payer: Self-pay | Admitting: Hematology and Oncology

## 2022-07-19 ENCOUNTER — Inpatient Hospital Stay: Payer: Medicare Other | Admitting: Hematology and Oncology

## 2022-07-19 VITALS — BP 126/67 | HR 65 | Temp 97.6°F | Resp 16

## 2022-07-19 DIAGNOSIS — D693 Immune thrombocytopenic purpura: Secondary | ICD-10-CM

## 2022-07-19 LAB — CBC: RBC: 4.02 (ref 3.87–5.11)

## 2022-07-19 LAB — BASIC METABOLIC PANEL
BUN: 11 (ref 4–21)
CO2: 26 — AB (ref 13–22)
Chloride: 105 (ref 99–108)
Creatinine: 0.6 (ref 0.5–1.1)
Glucose: 150
Potassium: 3.6 mEq/L (ref 3.5–5.1)
Sodium: 137 (ref 137–147)

## 2022-07-19 LAB — CBC AND DIFFERENTIAL
HCT: 35 — AB (ref 36–46)
Hemoglobin: 11.4 — AB (ref 12.0–16.0)
Neutrophils Absolute: 3.87
Platelets: 129 10*3/uL — AB (ref 150–400)
WBC: 5.3

## 2022-07-19 LAB — HEPATIC FUNCTION PANEL
ALT: 16 U/L (ref 7–35)
AST: 23 (ref 13–35)
Alkaline Phosphatase: 164 — AB (ref 25–125)
Bilirubin, Total: 1

## 2022-07-19 LAB — COMPREHENSIVE METABOLIC PANEL
Albumin: 3.7 (ref 3.5–5.0)
Calcium: 8.6 — AB (ref 8.7–10.7)

## 2022-07-19 MED ORDER — ROMIPLOSTIM 250 MCG ~~LOC~~ SOLR
3.1000 ug/kg | Freq: Once | SUBCUTANEOUS | Status: AC
  Administered 2022-07-19: 165 ug via SUBCUTANEOUS
  Filled 2022-07-19: qty 0.33

## 2022-07-19 MED FILL — Romiplostim For Inj 250 MCG: SUBCUTANEOUS | Qty: 0.33 | Status: AC

## 2022-07-19 NOTE — Patient Instructions (Signed)
Romiplostim Injection What is this medication? ROMIPLOSTIM (roe mi PLOE stim) treats low levels of platelets in your body caused by immune thrombocytopenia (ITP). It is prescribed when other medications have not worked or cannot be tolerated. It may also be used to help people who have been exposed to high doses of radiation. It works by increasing the amount of platelets in your blood. This lowers the risk of bleeding. This medicine may be used for other purposes; ask your health care provider or pharmacist if you have questions. COMMON BRAND NAME(S): Nplate What should I tell my care team before I take this medication? They need to know if you have any of these conditions: Blood clots Myelodysplastic syndrome An unusual or allergic reaction to romiplostim, mannitol, other medications, foods, dyes, or preservatives Pregnant or trying to get pregnant Breast-feeding How should I use this medication? This medication is injected under the skin. It is given by a care team in a hospital or clinic setting. A special MedGuide will be given to you before each treatment. Be sure to read this information carefully each time. Talk to your care team about the use of this medication in children. While it may be prescribed for children as young as newborns for selected conditions, precautions do apply. Overdosage: If you think you have taken too much of this medicine contact a poison control center or emergency room at once. NOTE: This medicine is only for you. Do not share this medicine with others. What if I miss a dose? Keep appointments for follow-up doses. It is important not to miss your dose. Call your care team if you are unable to keep an appointment. What may interact with this medication? Interactions are not expected. This list may not describe all possible interactions. Give your health care provider a list of all the medicines, herbs, non-prescription drugs, or dietary supplements you use. Also  tell them if you smoke, drink alcohol, or use illegal drugs. Some items may interact with your medicine. What should I watch for while using this medication? Visit your care team for regular checks on your progress. You may need blood work done while you are taking this medication. Your condition will be monitored carefully while you are receiving this medication. It is important not to miss any appointments. What side effects may I notice from receiving this medication? Side effects that you should report to your care team as soon as possible: Allergic reactions--skin rash, itching, hives, swelling of the face, lips, tongue, or throat Blood clot--pain, swelling, or warmth in the leg, shortness of breath, chest pain Side effects that usually do not require medical attention (report to your care team if they continue or are bothersome): Dizziness Joint pain Muscle pain Pain in the hands or feet Stomach pain Trouble sleeping This list may not describe all possible side effects. Call your doctor for medical advice about side effects. You may report side effects to FDA at 1-800-FDA-1088. Where should I keep my medication? This medication is given in a hospital or clinic. It will not be stored at home. NOTE: This sheet is a summary. It may not cover all possible information. If you have questions about this medicine, talk to your doctor, pharmacist, or health care provider.  2023 Elsevier/Gold Standard (2022-02-28 00:00:00)  

## 2022-07-19 NOTE — Assessment & Plan Note (Addendum)
Persistent thrombocytopenia despite pulse steroid dosing.  She does not tolerate steroid well.  Due to the severity of the thrombocytopenia, she had been getting frequent platelet transfusions.  She was therefore started on Nplate on July 27th and has good improvement in her thrombocytopenia. Platelets today are 129. She will resume Plavix and aspirin.  We will continue Nplate weekly.  We will plan to see her back in 1 week as scheduled.

## 2022-07-19 NOTE — Progress Notes (Signed)
Patient Care Team: Gordan Payment., MD as PCP - General (Internal Medicine) Runell Gess, MD as PCP - Cardiology (Cardiology) Georgeanna Lea, MD as Consulting Physician (Cardiology) Dellia Beckwith, MD as Consulting Physician (Oncology)  Clinic Day:  07/19/2022  Referring physician: Gordan Payment., MD  ASSESSMENT & PLAN:   Assessment & Plan: Immune thrombocytopenic purpura (HCC) Persistent thrombocytopenia despite pulse steroid dosing.  She does not tolerate steroid well.  Due to the severity of the thrombocytopenia, she had been getting frequent platelet transfusions.  She was therefore started on Nplate on July 27th and has good improvement in her thrombocytopenia. Platelets today are 129. She will resume Plavix and aspirin.  We will continue Nplate weekly.  We will plan to see her back in 1 week as scheduled.    The patient understands the plans discussed today and is in agreement with them.  She knows to contact our office if she develops concerns prior to her next appointment.     Pascal Lux, NP  [facility] AT [facility] [address] [place] [address] Dept: [phone] Dept Fax: [phone]   Orders Placed This Encounter  Procedures   CBC and differential    This external order was created through the Results Console.   CBC    This external order was created through the Results Console.   Basic metabolic panel    This external order was created through the Results Console.   Comprehensive metabolic panel    This external order was created through the Results Console.   Hepatic function panel    This external order was created through the Results Console.      CHIEF COMPLAINT:  CC: An 86 year old female with history of ITP here for weekly evaluation  Current Treatment:  Nplate  INTERVAL HISTORY:  Demetric is here today for repeat clinical assessment. She denies fevers or chills. She  denies pain. Her appetite is good. Her weight has been stable.  I have reviewed the past medical history, past surgical history, social history and family history with the patient and they are unchanged from previous note.  ALLERGIES:  has No Known Allergies.  MEDICATIONS:  Current Outpatient Medications  Medication Sig Dispense Refill   carvedilol (COREG) 6.25 MG tablet Take 6.25 mg by mouth 2 (two) times daily.     cyanocobalamin 1000 MCG tablet Take 1,000 mcg by mouth daily.     dexamethasone (DECADRON) 4 MG tablet Take 1 tablet (4 mg total) by mouth 3 (three) times daily. 9 tablet 1   donepezil (ARICEPT) 5 MG tablet Take 5 mg by mouth daily.     Folic Acid (FOLATE PO)      furosemide (LASIX) 20 MG tablet Take 20 mg by mouth as needed for fluid or edema.     haloperidol (HALDOL) 1 MG tablet TAKE 1 TABLET (1 MG TOTAL) BY MOUTH EVERY 8 (EIGHT) HOURS AS NEEDED FOR AGITATION. 60 tablet 1   LORazepam (ATIVAN) 1 MG tablet Take 1 tablet (1 mg total) by mouth every 8 (eight) hours. 30 tablet 0   losartan (COZAAR) 25 MG tablet Take 1 tablet by mouth daily.     methocarbamol (ROBAXIN) 500 MG tablet Take 500 mg by mouth.     metoprolol tartrate (LOPRESSOR) 25 MG tablet Take 1 tablet (25 mg total) by mouth 2 (two) times daily. 180 tablet 1   nitroGLYCERIN (NITROSTAT) 0.4 MG SL tablet Place 1 tablet (  0.4 mg total) under the tongue every 5 (five) minutes x 3 doses as needed for chest pain. 25 tablet 3   pantoprazole (PROTONIX) 40 MG tablet Take 1 tablet (40 mg total) by mouth daily. 90 tablet 1   sertraline (ZOLOFT) 50 MG tablet Take 50 mg by mouth daily.     No current facility-administered medications for this visit.   Facility-Administered Medications Ordered in Other Visits  Medication Dose Route Frequency Provider Last Rate Last Admin   romiPLOStim (NPLATE) injection 160 mcg  3 mcg/kg Subcutaneous Once Dellia Beckwith, MD        HISTORY OF PRESENT ILLNESS:   Oncology History   No  history exists.      REVIEW OF SYSTEMS:   Constitutional: Denies fevers, chills or abnormal weight loss Eyes: Denies blurriness of vision Ears, nose, mouth, throat, and face: Denies mucositis or sore throat Respiratory: Denies cough, dyspnea or wheezes Cardiovascular: Denies palpitation, chest discomfort or lower extremity swelling Gastrointestinal:  Denies nausea, heartburn or change in bowel habits Skin: Denies abnormal skin rashes Lymphatics: Denies new lymphadenopathy or easy bruising Neurological:Denies numbness, tingling or new weaknesses Behavioral/Psych: Mood is stable, no new changes  All other systems were reviewed with the patient and are negative.   VITALS:  Blood pressure 135/69, pulse 70, temperature 97.7 F (36.5 C), temperature source Oral, resp. rate 16, height 5\' 5"  (1.651 m), weight 117 lb 11.2 oz (53.4 kg), SpO2 94 %.  Wt Readings from Last 3 Encounters:  07/19/22 117 lb 11.2 oz (53.4 kg)  07/12/22 120 lb (54.4 kg)  07/12/22 120 lb 9.6 oz (54.7 kg)    Body mass index is 19.59 kg/m.  Performance status (ECOG): 2 - Symptomatic, <50% confined to bed  PHYSICAL EXAM:   GENERAL:alert, no distress and comfortable SKIN: skin color, texture, turgor are normal, no rashes or significant lesions EYES: normal, Conjunctiva are pink and non-injected, sclera clear OROPHARYNX:no exudate, no erythema and lips, buccal mucosa, and tongue normal  NECK: supple, thyroid normal size, non-tender, without nodularity LYMPH:  no palpable lymphadenopathy in the cervical, axillary or inguinal LUNGS: clear to auscultation and percussion with normal breathing effort HEART: regular rate & rhythm and no murmurs and no lower extremity edema ABDOMEN:abdomen soft, non-tender and normal bowel sounds Musculoskeletal:no cyanosis of digits and no clubbing  NEURO: alert & oriented x 3 with fluent speech, no focal motor/sensory deficits  LABORATORY DATA:  I have reviewed the data as listed     Component Value Date/Time   NA 137 07/19/2022 0000   K 3.6 07/19/2022 0000   CL 105 07/19/2022 0000   CO2 26 (A) 07/19/2022 0000   GLUCOSE 113 (H) 04/17/2022 1218   GLUCOSE 114 (H) 03/12/2022 0337   BUN 11 07/19/2022 0000   CREATININE 0.6 07/19/2022 0000   CREATININE 0.97 04/17/2022 1218   CALCIUM 8.6 (A) 07/19/2022 0000   PROT 5.8 (A) 04/27/2022 0000   ALBUMIN 3.7 07/19/2022 0000   AST 23 07/19/2022 0000   ALT 16 07/19/2022 0000   ALKPHOS 164 (A) 07/19/2022 0000   BILITOT 1.1 03/09/2022 0655   GFRNONAA >60 03/12/2022 0337    No results found for: "SPEP", "UPEP"  Lab Results  Component Value Date   WBC 5.3 07/19/2022   NEUTROABS 3.87 07/19/2022   HGB 11.4 (A) 07/19/2022   HCT 35 (A) 07/19/2022   MCV 88 05/18/2022   PLT 129 (A) 07/19/2022      Chemistry      Component Value  Date/Time   NA 137 07/19/2022 0000   K 3.6 07/19/2022 0000   CL 105 07/19/2022 0000   CO2 26 (A) 07/19/2022 0000   BUN 11 07/19/2022 0000   CREATININE 0.6 07/19/2022 0000   CREATININE 0.97 04/17/2022 1218   GLU 150 07/19/2022 0000      Component Value Date/Time   CALCIUM 8.6 (A) 07/19/2022 0000   ALKPHOS 164 (A) 07/19/2022 0000   AST 23 07/19/2022 0000   ALT 16 07/19/2022 0000   BILITOT 1.1 03/09/2022 0655       RADIOGRAPHIC STUDIES: I have personally reviewed the radiological images as listed and agreed with the findings in the report. No results found.

## 2022-07-21 ENCOUNTER — Encounter: Payer: Self-pay | Admitting: Oncology

## 2022-07-21 NOTE — Progress Notes (Signed)
Clinic Day:  06/22/22 [facility] AT [place]   Referring physician: Gordan Payment., MD   ASSESSMENT & PLAN:    Assessment & Plan: Immune thrombocytopenic purpura (HCC) Her ITP is escalating rapidly now and is refractory to the steroids. We will support with platelet transfusions. But she is not a candidate for splenectomy, rituxan infusions or IVIG. I have therfore discussed the possibility of Nplate injections as the most benign approach for her. We will discuss this further next week.    S]SEVERE WORSENING DEMENTIA She is no longer eating well and steadily losing weight. We discussed palliative care for this problem and she will be a candidate for hospice if this continues to progress.       Unfortunately, her platelets have been dramatically decreasing. We have set up[ a transfusion appointment for tomorrow 06/23/2022 at 1:30. We will continue to monitor weekly and try to avoid further steroids if possible, due to the side effects of agitation and insomnia.  I will plan to see her back in 1 week for repeat clinical assessment with CBC and CMP. We discussed possible weekly NPlate injections.  The patient understands the plans discussed today and is in agreement with them.  She knows to contact our office if she develops concerns prior to her next appointment.     I provided 20 minutes of face-to-face time during this encounter and > 50% was spent counseling as documented under my assessment and plan.      Dellia Beckwith, MD  [facility] AT [facility] [address] [place] [address] Dept: [phone] Dept Fax: [phone]        Orders Placed This Encounter  Procedures   CBC and differential      This external order was created through the Results Console.   CBC      This external order was created through the Results Console.   Basic metabolic panel      This external order was created through the  Results Console.   Comprehensive metabolic panel      This external order was created through the Results Console.   Hepatic function panel      This external order was created through the Results Console.        CHIEF COMPLAINT:  CC: ITP   Current Treatment: Pulsed dexamethasone as needed   HISTORY OF PRESENT ILLNESS:  Denielle Bayard is a 86 y.o. female with dementia with immune thrombocytopenic purpura.  We began seeing her on May 12th, at which time her platelet count was 31,000.  She has a history of CVA in February, for which she was on aspirin 81 mg daily.  On April 6th, she presented to [facility] ER after a fall with significant bruising of her face.  She had mild thrombocytopenia with a platelet count of 119,000.  White blood count and hemoglobin were normal.  CT head did not reveal any intracranial injury.  There was a forehead hematoma without calvarial fracture. Chronic small vessel disease with infarcts was seen.  EKG revealed ST elevation consistent with an acute myocardial infarction.  She was transferred to [facility] and underwent cardiac catheterization with stenting of the right coronary artery.  Her folate level was mildly low at 5.6.  She was discharged home on aspirin 81 mg daily and Brilinta 90 mg twice daily.  She was seen at the [facility] ER again on April 12th with general weakness and  her platelet count remained low at 117,000.  Her hemoglobin was mildly low at 11.7 and bilirubin was 2.  She presented to the ER again on April 15th with restlessness and anxiety, which she had been experiencing since her hospitalization for MRI.  She had persistent thrombocytopenia with a platelet count of 117,000, but normal white count and hemoglobin. The bilirubin was 1.5.  No other specific concerns were found.  She presented to the [facility] ER again on April 28th with general weakness and dizziness. Her CT head did not reveal any acute abnormality.  CTA chest did not  reveal acute pulmonary embolism.  There were multiple pulmonary nodules the largest measuring 2.8 cm.  Repeat CT or PET scan was recommended.  Small bilateral pleural effusions with mild interstitial edema was seen.  She also had elevation of her BNP.  She was admitted and diuresed.  Echocardiogram revealed mild concentric left ventricular hypertrophy with overall normal left ventricular function with an ejection fraction of 55 to 60%.  She had persistent thrombocytopenia with a platelet count of 66,000 to 79,00 during her 3-day admission.  Her hemoglobin fluctuated up and down but remained above 11.  White blood count remained normal and bilirubin was normal.  She saw Dr. Shary Decamp for follow-up on May 8th at which time her platelet count had dropped to 29,000, so she was referred here.  White blood cells, hemoglobin and bilirubin remained normal.  Dr. Allyson Sabal switched from [facility] to Plavix due to thrombocytopenia on May 13th.   Evaluation in our office revealed persistent mild folate deficiency with folic acid of 5.2.  B12 was normal.  There was no evidence of hemolysis.  Serum protein electrophoresis did not reveal a monoclonal protein.  We felt this was most consistent with immune thrombocytopenic purpura.  She was treated with pulse dexamethasone 12 mg daily for 5 days, which is a reduced dose, as we were concerned higher doses or treatment for an extended period of time would worsen her dementia and agitation.  She was also given haloperidol 1 mg every 8 hours to use as needed for agitation.  She tolerated dexamethasone better than expected, but did require Haldol fairly regularly.  Initially, her platelet count went up to 69,000, but since that time, it had steadily decreased.  As her platelet count remained above 20,000, we opted for observation due to difficulty tolerating the dexamethasone.  She also had borderline anemia.  She has been steadily losing weight.   Her platelets dropped down to 23,000 on  June 29, so she was given another pulse dexamethasone at a reduced dose of 4 mg 3 times daily for 3 days due to previous poor tolerance.  Her husband was unable to bring her for her appointment the following week.  She presented to the emergency room on July 10 with near syncope, hypotension and dehydration.  Her platelet count was 14,000 despite the pulse of dexamethasone.  She was admitted for further management of her hypotension and dehydration.  She received 1 platelet pheresis and started on dexamethasone 20 mg daily.  She and her husband insisted on discharge later that day, so she was sent out on oral dexamethasone.  We arranged for her to have a platelet transfusion as an outpatient on July 12.   INTERVAL HISTORY:  Deanie is here today for a routine follow up.  Her platelets are low at 11,000. I did recommend a transfusion and have an appointment ready for tomorrow at 1:30. She continues to use  a cane.  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  She is not sitting still. Her appetite is decreased and her husband is working to try to get her to eat more, as well as use nutritional supplements. She continues folic acid daily. Her weight is down 1 pound since her last visit..     Wt Readings from Last 3 Encounters:  07/19/22 117 lb 11.2 oz (53.4 kg)  07/12/22 120 lb (54.4 kg)  07/12/22 120 lb 9.6 oz (54.7 kg)  We did discuss palliative care for treatment. We have discussed using Nplate once a week but will continue to research more on it and discuss further at their next appointment.She denies pain.She denies fevers or chills.       REVIEW OF SYSTEMS:  Review of Systems  Constitutional:  Negative for appetite change, chills, fatigue, fever and unexpected weight change.  HENT:   Negative for lump/mass, mouth sores and sore throat.   Respiratory:  Negative for cough and shortness of breath.   Cardiovascular:  Negative for chest pain and leg swelling.   Gastrointestinal:  Negative for abdominal pain, constipation, diarrhea, nausea and vomiting.  Endocrine: Negative for hot flashes.  Genitourinary:  Negative for difficulty urinating, dysuria, frequency and hematuria.   Musculoskeletal:  Negative for arthralgias, back pain and myalgias.  Skin:  Negative for rash.  Neurological:  Negative for dizziness and headaches.  Hematological:  Negative for adenopathy. Does not bruise/bleed easily.  Psychiatric/Behavioral:  Negative for depression and sleep disturbance. The patient is not nervous/anxious.       VITALS:  Blood pressure (!) 154/75, pulse 79, temperature 98.4 F (36.9 C), temperature source Oral, resp. rate 18, height 5' 4.3" (1.633 m), weight 118 lb 8 oz (53.8 kg), SpO2 96 %.     Wt Readings from Last 3 Encounters:  07/19/22 117 lb 11.2 oz (53.4 kg)  07/12/22 120 lb (54.4 kg)  07/12/22 120 lb 9.6 oz (54.7 kg)    Body mass index is 20.15 kg/m.   Performance status (ECOG): 2 - Symptomatic, <50% confined to bed   PHYSICAL EXAM:  Physical Exam Vitals and nursing note reviewed.  Constitutional:      General: She is not in acute distress.    Appearance: Normal appearance.  HENT:     Head: Normocephalic and atraumatic.     Mouth/Throat:     Mouth: Mucous membranes are moist.     Pharynx: Oropharynx is clear. No oropharyngeal exudate or posterior oropharyngeal erythema.  Eyes:     General: No scleral icterus.    Extraocular Movements: Extraocular movements intact.     Conjunctiva/sclera: Conjunctivae normal.     Pupils: Pupils are equal, round, and reactive to light.  Cardiovascular:     Rate and Rhythm: Normal rate and regular rhythm.     Heart sounds: Normal heart sounds. No murmur heard.    No friction rub. No gallop.  Pulmonary:     Effort: Pulmonary effort is normal.     Breath sounds: Normal breath sounds. No wheezing, rhonchi or rales.  Abdominal:     General: There is no distension.     Palpations: Abdomen is  soft. There is no hepatomegaly, splenomegaly or mass.     Tenderness: There is no abdominal tenderness.  Musculoskeletal:        General: Normal range of motion.     Cervical back: Normal range of motion and neck supple. No tenderness.  Right lower leg: No edema.     Left lower leg: No edema.  Lymphadenopathy:     Cervical: No cervical adenopathy.     Upper Body:     Right upper body: No supraclavicular or axillary adenopathy.     Left upper body: No supraclavicular or axillary adenopathy.     Lower Body: No right inguinal adenopathy. No left inguinal adenopathy.  Skin:    General: Skin is warm and dry.     Coloration: Skin is not jaundiced.     Findings: No rash.  Neurological:     Mental Status: She is alert and oriented to person, place, and time.     Cranial Nerves: No cranial nerve deficit.  Psychiatric:        Mood and Affect: Mood normal.        Behavior: Behavior normal.        Thought Content: Thought content normal.        LABS:        Latest Ref Rng & Units 07/19/2022   12:00 AM 07/12/2022   12:00 AM 07/06/2022   12:00 AM  CBC  WBC   5.3     4.7     4.7      Hemoglobin 12.0 - 16.0 11.4     10.3     9.1      Hematocrit 36 - 46 35     31     28      Platelets 150 - 400 K/uL 129     54     17         This result is from an external source.        Latest Ref Rng & Units 07/19/2022   12:00 AM 07/12/2022   12:00 AM 06/22/2022   12:00 AM  CMP  BUN 4 - 21 11     14     13       Creatinine 0.5 - 1.1 0.6     0.5     0.7      Sodium 137 - 147 137     135     136      Potassium 3.5 - 5.1 mEq/L 3.6     3.6     4.1      Chloride 99 - 108 105     104     104      CO2 13 - 22 26     25     26       Calcium 8.7 - 10.7 8.6     8.5     8.9      Alkaline Phos 25 - 125 164     140     57      AST 13 - 35 23     27     21       ALT 7 - 35 U/L 16     17     18          This result is from an external source.        Recent Labs  No results found for: "CEA1", "CEA"   /  Last  Labs  No results found for: "CEA1", "CEA"   Recent Labs  No results found for: "PSA1"   Recent Labs  No results found for: " "   Recent Labs  No results found for: " "    Recent Labs  Lab Results  Component Value Date    TOTALPROTELP 6.0 04/14/2022    ALBUMINELP 3.5 04/14/2022    A1GS 0.3 04/14/2022    A2GS 0.5 04/14/2022    BETS 0.7 04/14/2022    GAMS 1.0 04/14/2022    MSPIKE Not Observed 04/14/2022    SPEI Comment 04/14/2022      Recent Labs  No results found for: "TIBC", "FERRITIN", "IRONPCTSAT"   Recent Labs       Lab Results  Component Value Date    LDH 189 04/14/2022        STUDIES:  Imaging Results  No results found.        HISTORY:        Past Medical History:  Diagnosis Date   Acute ST elevation myocardial infarction (STEMI) of inferior wall (HCC) 03/09/2022   Age-related osteoporosis without current pathological fracture 04/20/2021    Formatting of this note might be different from the original. Dexa 04/2021. [facility]   Anxiety disorder 03/23/2021    Formatting of this note might be different from the original. Tried and failed escitalopram   At high risk for falls 01/23/2022   CAD (coronary artery disease)      a. s/p STEMI in 03/2022 with DES to prox/mid RCA.   Cerebral atherosclerosis     Delirium superimposed on dementia 03/10/2022   Essential hypertension     Fall 03/09/2022   Folate deficiency 04/27/2022   GERD without esophagitis 08/18/2021   History of arterial ischemic stroke 01/23/2022    Formatting of this note might be different from the original. 01/2022   Hyperlipidemia     Malaise and fatigue 03/23/2021   Mild cognitive impairment     Mild episode of recurrent major depressive disorder (HCC) 09/26/2021   Nocturia 09/26/2021   Osteoporosis     Primary osteoarthritis involving multiple joints 08/18/2021   Seborrheic keratosis 09/26/2021   Stroke (HCC)     Thrombocytopenia (HCC)     Vitamin B12 deficiency              Past Surgical History:  Procedure Laterality Date   CORONARY/GRAFT ACUTE MI REVASCULARIZATION N/A 03/09/2022    Procedure: Coronary/Graft Acute MI Revascularization;  Surgeon: Runell Gess, MD;  Location: [facility] INVASIVE CV LAB;  Service: Cardiovascular;  Laterality: N/A;   LEFT HEART CATH AND CORONARY ANGIOGRAPHY N/A 03/09/2022    Procedure: LEFT HEART CATH AND CORONARY ANGIOGRAPHY;  Surgeon: Runell Gess, MD;  Location: [facility] INVASIVE CV LAB;  Service: Cardiovascular;  Laterality: N/A;           Family History  Problem Relation Age of Onset   Stroke Mother     CAD Father     Heart attack Father        Social History:  reports that she has never smoked. She has never used smokeless tobacco. She reports current alcohol use. She reports that she does not use drugs.The patient is accompanied by her husband today.   Allergies: No Known Allergies   Current Medications:       Current Outpatient Medications  Medication Sig Dispense Refill   carvedilol (COREG) 6.25 MG tablet Take 6.25 mg by mouth 2 (two) times daily.       cyanocobalamin 1000 MCG tablet Take 1,000 mcg by mouth daily.       dexamethasone (DECADRON) 4 MG tablet Take 1 tablet (4 mg total) by mouth 3 (three) times daily. 9 tablet 1   donepezil (ARICEPT) 5 MG tablet  Take 5 mg by mouth daily.       Folic Acid (FOLATE PO)         furosemide (LASIX) 20 MG tablet Take 20 mg by mouth as needed for fluid or edema.       haloperidol (HALDOL) 1 MG tablet TAKE 1 TABLET (1 MG TOTAL) BY MOUTH EVERY 8 (EIGHT) HOURS AS NEEDED FOR AGITATION. 60 tablet 1   LORazepam (ATIVAN) 1 MG tablet Take 1 tablet (1 mg total) by mouth every 8 (eight) hours. 30 tablet 0   losartan (COZAAR) 25 MG tablet Take 1 tablet by mouth daily.       methocarbamol (ROBAXIN) 500 MG tablet Take 500 mg by mouth.       metoprolol tartrate (LOPRESSOR) 25 MG tablet Take 1 tablet (25 mg total) by mouth 2 (two) times daily. 180 tablet 1   nitroGLYCERIN (NITROSTAT)  0.4 MG SL tablet Place 1 tablet (0.4 mg total) under the tongue every 5 (five) minutes x 3 doses as needed for chest pain. 25 tablet 3   pantoprazole (PROTONIX) 40 MG tablet Take 1 tablet (40 mg total) by mouth daily. 90 tablet 1   sertraline (ZOLOFT) 50 MG tablet Take 50 mg by mouth daily.        No current facility-administered medications for this visit.          I,Gabriella Ballesteros,acting as a scribe for Dellia Beckwithhristine H Luisana Lutzke, MD.,have documented all relevant documentation on the behalf of Dellia Beckwithhristine H Crystie Yanko, MD,as directed by  Dellia Beckwithhristine H Keyler Hoge, MD while in the presence of Dellia Beckwithhristine H Trevino Wyatt, MD.

## 2022-07-24 ENCOUNTER — Encounter: Payer: Self-pay | Admitting: Oncology

## 2022-07-24 ENCOUNTER — Other Ambulatory Visit: Payer: Self-pay | Admitting: Hematology and Oncology

## 2022-07-24 DIAGNOSIS — R451 Restlessness and agitation: Secondary | ICD-10-CM

## 2022-07-26 ENCOUNTER — Inpatient Hospital Stay: Payer: Medicare Other | Admitting: Hematology and Oncology

## 2022-07-26 ENCOUNTER — Inpatient Hospital Stay: Payer: Medicare Other

## 2022-07-26 ENCOUNTER — Encounter: Payer: Self-pay | Admitting: Hematology and Oncology

## 2022-07-26 VITALS — BP 124/50 | HR 72 | Temp 98.4°F | Resp 16

## 2022-07-26 DIAGNOSIS — D693 Immune thrombocytopenic purpura: Secondary | ICD-10-CM

## 2022-07-26 LAB — BASIC METABOLIC PANEL
BUN: 11 (ref 4–21)
CO2: 24 — AB (ref 13–22)
Chloride: 105 (ref 99–108)
Creatinine: 0.6 (ref 0.5–1.1)
Glucose: 200
Potassium: 3.9 mEq/L (ref 3.5–5.1)
Sodium: 137 (ref 137–147)

## 2022-07-26 LAB — HEPATIC FUNCTION PANEL
ALT: 16 U/L (ref 7–35)
AST: 23 (ref 13–35)
Alkaline Phosphatase: 89 (ref 25–125)
Bilirubin, Total: 0.5

## 2022-07-26 LAB — CBC AND DIFFERENTIAL
HCT: 40 (ref 36–46)
Hemoglobin: 13 (ref 12.0–16.0)
Neutrophils Absolute: 3.91
Platelets: 114 10*3/uL — AB (ref 150–400)
WBC: 5.5

## 2022-07-26 LAB — COMPREHENSIVE METABOLIC PANEL
Albumin: 3.9 (ref 3.5–5.0)
Calcium: 9 (ref 8.7–10.7)

## 2022-07-26 LAB — CBC: RBC: 4.59 (ref 3.87–5.11)

## 2022-07-26 MED ORDER — ROMIPLOSTIM 250 MCG ~~LOC~~ SOLR
3.0000 ug/kg | Freq: Once | SUBCUTANEOUS | Status: AC
  Administered 2022-07-26: 160 ug via SUBCUTANEOUS
  Filled 2022-07-26: qty 0.32

## 2022-07-26 NOTE — Patient Instructions (Signed)
Romiplostim Injection What is this medication? ROMIPLOSTIM (roe mi PLOE stim) treats low levels of platelets in your body caused by immune thrombocytopenia (ITP). It is prescribed when other medications have not worked or cannot be tolerated. It may also be used to help people who have been exposed to high doses of radiation. It works by increasing the amount of platelets in your blood. This lowers the risk of bleeding. This medicine may be used for other purposes; ask your health care provider or pharmacist if you have questions. COMMON BRAND NAME(S): Nplate What should I tell my care team before I take this medication? They need to know if you have any of these conditions: Blood clots Myelodysplastic syndrome An unusual or allergic reaction to romiplostim, mannitol, other medications, foods, dyes, or preservatives Pregnant or trying to get pregnant Breast-feeding How should I use this medication? This medication is injected under the skin. It is given by a care team in a hospital or clinic setting. A special MedGuide will be given to you before each treatment. Be sure to read this information carefully each time. Talk to your care team about the use of this medication in children. While it may be prescribed for children as young as newborns for selected conditions, precautions do apply. Overdosage: If you think you have taken too much of this medicine contact a poison control center or emergency room at once. NOTE: This medicine is only for you. Do not share this medicine with others. What if I miss a dose? Keep appointments for follow-up doses. It is important not to miss your dose. Call your care team if you are unable to keep an appointment. What may interact with this medication? Interactions are not expected. This list may not describe all possible interactions. Give your health care provider a list of all the medicines, herbs, non-prescription drugs, or dietary supplements you use. Also  tell them if you smoke, drink alcohol, or use illegal drugs. Some items may interact with your medicine. What should I watch for while using this medication? Visit your care team for regular checks on your progress. You may need blood work done while you are taking this medication. Your condition will be monitored carefully while you are receiving this medication. It is important not to miss any appointments. What side effects may I notice from receiving this medication? Side effects that you should report to your care team as soon as possible: Allergic reactions--skin rash, itching, hives, swelling of the face, lips, tongue, or throat Blood clot--pain, swelling, or warmth in the leg, shortness of breath, chest pain Side effects that usually do not require medical attention (report to your care team if they continue or are bothersome): Dizziness Joint pain Muscle pain Pain in the hands or feet Stomach pain Trouble sleeping This list may not describe all possible side effects. Call your doctor for medical advice about side effects. You may report side effects to FDA at 1-800-FDA-1088. Where should I keep my medication? This medication is given in a hospital or clinic. It will not be stored at home. NOTE: This sheet is a summary. It may not cover all possible information. If you have questions about this medicine, talk to your doctor, pharmacist, or health care provider.  2023 Elsevier/Gold Standard (2022-02-28 00:00:00)  

## 2022-07-26 NOTE — Assessment & Plan Note (Addendum)
Persistent thrombocytopenia despite pulse steroid dosing.  She does not tolerate steroid well.  Due to the severity of the thrombocytopenia, she had been getting frequent platelet transfusions.  She was therefore started on Nplate on July 27th and has good improvement in her thrombocytopenia. Platelets today are 114. She has resumed Plavix and aspirin.  We will continue Nplate weekly. Her husband is interested in pursuing palliative care for the patient. I will discuss this with Dr. Gilman Buttner and refer if appropriate. We will plan to see her back in 1 week as scheduled.

## 2022-07-26 NOTE — Progress Notes (Addendum)
Patient Care Team: Gordan Payment., MD as PCP - General (Internal Medicine) Runell Gess, MD as PCP - Cardiology (Cardiology) Georgeanna Lea, MD as Consulting Physician (Cardiology) Dellia Beckwith, MD as Consulting Physician (Oncology)  Clinic Day:  07/26/2022  Referring physician: Gordan Payment., MD  ASSESSMENT & PLAN:   Assessment & Plan: Immune thrombocytopenic purpura (HCC) Persistent thrombocytopenia despite pulse steroid dosing.  She does not tolerate steroid well.  Due to the severity of the thrombocytopenia, she had been getting frequent platelet transfusions.  She was therefore started on Nplate on July 27th and has good improvement in her thrombocytopenia. Platelets today are 114. She has resumed Plavix and aspirin.  We will continue Nplate weekly. Her husband is interested in pursuing palliative care for the patient. I will discuss this with Dr. Gilman Buttner and refer if appropriate. We will plan to see her back in 1 week as scheduled.    The patient understands the plans discussed today and is in agreement with them.  She knows to contact our office if she develops concerns prior to her next appointment.    Maria Lux, NP  [facility] AT [facility] [address] [place] [address] Dept: [phone] Dept Fax: [phone]   Orders Placed This Encounter  Procedures   CBC and differential    This external order was created through the Results Console.   CBC    This external order was created through the Results Console.   Basic metabolic panel    This external order was created through the Results Console.   Comprehensive metabolic panel    This external order was created through the Results Console.   Hepatic function panel    This external order was created through the Results Console.      CHIEF COMPLAINT:  CC: An 86 year old female with history of ITP here for weekly  evaluation  Current Treatment:  Nplate  INTERVAL HISTORY:  Maria Holloway is here today for repeat clinical assessment. She denies fevers or chills. She denies pain. Her appetite is good. Her weight has been stable.  I have reviewed the past medical history, past surgical history, social history and family history with the patient and they are unchanged from previous note.  ALLERGIES:  has No Known Allergies.  MEDICATIONS:  Current Outpatient Medications  Medication Sig Dispense Refill   aspirin EC 81 MG tablet Take 81 mg by mouth daily. Swallow whole.     clopidogrel (PLAVIX) 75 MG tablet Take 75 mg by mouth daily.     carvedilol (COREG) 6.25 MG tablet Take 6.25 mg by mouth 2 (two) times daily.     cyanocobalamin 1000 MCG tablet Take 1,000 mcg by mouth daily.     dexamethasone (DECADRON) 4 MG tablet Take 1 tablet (4 mg total) by mouth 3 (three) times daily. 9 tablet 1   donepezil (ARICEPT) 5 MG tablet Take 5 mg by mouth daily.     Folic Acid (FOLATE PO)      furosemide (LASIX) 20 MG tablet Take 20 mg by mouth as needed for fluid or edema.     haloperidol (HALDOL) 1 MG tablet TAKE 1 TABLET (1 MG TOTAL) BY MOUTH EVERY 8 (EIGHT) HOURS AS NEEDED FOR AGITATION. 60 tablet 1   LORazepam (ATIVAN) 1 MG tablet Take 1 tablet (1 mg total) by mouth every 8 (eight) hours. 30 tablet 0   losartan (COZAAR) 50 MG tablet Take 50 mg by  mouth daily.     methocarbamol (ROBAXIN) 500 MG tablet Take 500 mg by mouth.     metoprolol tartrate (LOPRESSOR) 25 MG tablet Take 1 tablet (25 mg total) by mouth 2 (two) times daily. 180 tablet 1   nitroGLYCERIN (NITROSTAT) 0.4 MG SL tablet Place 1 tablet (0.4 mg total) under the tongue every 5 (five) minutes x 3 doses as needed for chest pain. 25 tablet 3   pantoprazole (PROTONIX) 40 MG tablet Take 1 tablet (40 mg total) by mouth daily. 90 tablet 1   sertraline (ZOLOFT) 50 MG tablet Take 50 mg by mouth daily.     No current facility-administered medications for this visit.     HISTORY OF PRESENT ILLNESS:   Oncology History   No history exists.      REVIEW OF SYSTEMS:   Constitutional: Denies fevers, chills or abnormal weight loss Eyes: Denies blurriness of vision Ears, nose, mouth, throat, and face: Denies mucositis or sore throat Respiratory: Denies cough, dyspnea or wheezes Cardiovascular: Denies palpitation, chest discomfort or lower extremity swelling Gastrointestinal:  Denies nausea, heartburn or change in bowel habits Skin: Denies abnormal skin rashes Lymphatics: Denies new lymphadenopathy or easy bruising Neurological:Denies numbness, tingling or new weaknesses Behavioral/Psych: Mood is stable, no new changes  All other systems were reviewed with the patient and are negative.   VITALS:  Blood pressure (!) 100/59, pulse 76, temperature 98 F (36.7 C), temperature source Oral, resp. rate 16, height 5\' 5"  (1.651 m), weight 115 lb 12.8 oz (52.5 kg), SpO2 93 %.  Wt Readings from Last 3 Encounters:  07/26/22 115 lb 12.8 oz (52.5 kg)  07/19/22 117 lb 11.2 oz (53.4 kg)  07/12/22 120 lb (54.4 kg)    Body mass index is 19.27 kg/m.  Performance status (ECOG): 3 - Symptomatic, >50% confined to bed  PHYSICAL EXAM:   GENERAL:alert, no distress and comfortable SKIN: skin color, texture, turgor are normal, no rashes or significant lesions EYES: normal, Conjunctiva are pink and non-injected, sclera clear OROPHARYNX:no exudate, no erythema and lips, buccal mucosa, and tongue normal  NECK: supple, thyroid normal size, non-tender, without nodularity LYMPH:  no palpable lymphadenopathy in the cervical, axillary or inguinal LUNGS: clear to auscultation and percussion with normal breathing effort HEART: regular rate & rhythm and no murmurs and no lower extremity edema ABDOMEN:abdomen soft, non-tender and normal bowel sounds Musculoskeletal:no cyanosis of digits and no clubbing  NEURO: alert & oriented x 3 with fluent speech, no focal motor/sensory  deficits  LABORATORY DATA:  I have reviewed the data as listed    Component Value Date/Time   NA 137 07/26/2022 0000   K 3.9 07/26/2022 0000   CL 105 07/26/2022 0000   CO2 24 (A) 07/26/2022 0000   GLUCOSE 113 (H) 04/17/2022 1218   GLUCOSE 114 (H) 03/12/2022 0337   BUN 11 07/26/2022 0000   CREATININE 0.6 07/26/2022 0000   CREATININE 0.97 04/17/2022 1218   CALCIUM 9.0 07/26/2022 0000   PROT 5.8 (A) 04/27/2022 0000   ALBUMIN 3.9 07/26/2022 0000   AST 23 07/26/2022 0000   ALT 16 07/26/2022 0000   ALKPHOS 89 07/26/2022 0000   BILITOT 1.1 03/09/2022 0655   GFRNONAA >60 03/12/2022 0337    No results found for: "SPEP", "UPEP"  Lab Results  Component Value Date   WBC 5.5 07/26/2022   NEUTROABS 3.91 07/26/2022   HGB 13.0 07/26/2022   HCT 40 07/26/2022   MCV 88 05/18/2022   PLT 114 (A) 07/26/2022  Chemistry      Component Value Date/Time   NA 137 07/26/2022 0000   K 3.9 07/26/2022 0000   CL 105 07/26/2022 0000   CO2 24 (A) 07/26/2022 0000   BUN 11 07/26/2022 0000   CREATININE 0.6 07/26/2022 0000   CREATININE 0.97 04/17/2022 1218   GLU 200 07/26/2022 0000      Component Value Date/Time   CALCIUM 9.0 07/26/2022 0000   ALKPHOS 89 07/26/2022 0000   AST 23 07/26/2022 0000   ALT 16 07/26/2022 0000   BILITOT 1.1 03/09/2022 0655       RADIOGRAPHIC STUDIES: I have personally reviewed the radiological images as listed and agreed with the findings in the report. No results found.

## 2022-07-28 ENCOUNTER — Telehealth: Payer: Self-pay

## 2022-07-28 NOTE — Telephone Encounter (Signed)
-----   Message from Pascal Lux, NP sent at 07/27/2022  3:45 PM EDT ----- Regarding: RE: Palliative Care [facility]  Update We think that they can still do Nplate with palliative care instead of Hospice. They are doing that for another patient.   ----- Message ----- From: Hipolito Bayley, RN Sent: 07/27/2022   3:03 PM EDT To: Dellia Beckwith, MD; Pascal Lux, NP Subject: Palliative Care [facility]  Update       Tammy, director of Palliative Care program, [facility] has called with an update. Pt's spouse called them this morning to report she is weaker and he thinks she is having significant changes. He was open to someone coming out to the home and discuss what Hospice would look like for his wife in the future. She doesn't think he will accept Hospice services tomorrow, even though she is Hospice appropriate. She thinks he just wants more information. She doesn't think he is ready to stop labs and transfusions just yet. They are going out to speak with him tomorrow.

## 2022-07-31 ENCOUNTER — Other Ambulatory Visit: Payer: Self-pay

## 2022-07-31 NOTE — Telephone Encounter (Signed)
Tammy,from [facility], called to give [place] an update after they talked with pt's spouse regarding Hospice benefits. "He just really wanted som information to know what to do when the times comes. He doesn't want Hospice fo her @ this time. She spoke to him this morning and he told her Abrie had a pretty good weekend. They plan to visit pt again tomorrow. [phone]

## 2022-08-02 ENCOUNTER — Inpatient Hospital Stay: Payer: Medicare Other

## 2022-08-02 ENCOUNTER — Encounter: Payer: Self-pay | Admitting: Hematology and Oncology

## 2022-08-02 ENCOUNTER — Inpatient Hospital Stay (INDEPENDENT_AMBULATORY_CARE_PROVIDER_SITE_OTHER): Payer: Medicare Other | Admitting: Hematology and Oncology

## 2022-08-02 VITALS — BP 160/77 | HR 72 | Resp 18

## 2022-08-02 DIAGNOSIS — D693 Immune thrombocytopenic purpura: Secondary | ICD-10-CM | POA: Diagnosis not present

## 2022-08-02 LAB — BASIC METABOLIC PANEL
BUN: 15 (ref 4–21)
CO2: 24 — AB (ref 13–22)
Chloride: 108 (ref 99–108)
Creatinine: 0.5 (ref 0.5–1.1)
Glucose: 111
Potassium: 3.6 mEq/L (ref 3.5–5.1)
Sodium: 138 (ref 137–147)

## 2022-08-02 LAB — CBC AND DIFFERENTIAL
HCT: 35 — AB (ref 36–46)
Hemoglobin: 11.4 — AB (ref 12.0–16.0)
Neutrophils Absolute: 4.68
Platelets: 118 10*3/uL — AB (ref 150–400)
WBC: 6.5

## 2022-08-02 LAB — HEPATIC FUNCTION PANEL
ALT: 18 U/L (ref 7–35)
AST: 23 (ref 13–35)
Alkaline Phosphatase: 173 — AB (ref 25–125)
Bilirubin, Total: 0.9

## 2022-08-02 LAB — CBC: RBC: 4.11 (ref 3.87–5.11)

## 2022-08-02 LAB — COMPREHENSIVE METABOLIC PANEL
Albumin: 3.6 (ref 3.5–5.0)
Calcium: 8.8 (ref 8.7–10.7)

## 2022-08-02 MED ORDER — ROMIPLOSTIM 250 MCG ~~LOC~~ SOLR
3.1000 ug/kg | Freq: Once | SUBCUTANEOUS | Status: AC
  Administered 2022-08-02: 160 ug via SUBCUTANEOUS
  Filled 2022-08-02: qty 0.32

## 2022-08-02 NOTE — Patient Instructions (Signed)
Romiplostim Injection What is this medication? ROMIPLOSTIM (roe mi PLOE stim) treats low levels of platelets in your body caused by immune thrombocytopenia (ITP). It is prescribed when other medications have not worked or cannot be tolerated. It may also be used to help people who have been exposed to high doses of radiation. It works by increasing the amount of platelets in your blood. This lowers the risk of bleeding. This medicine may be used for other purposes; ask your health care provider or pharmacist if you have questions. COMMON BRAND NAME(S): Nplate What should I tell my care team before I take this medication? They need to know if you have any of these conditions: Blood clots Myelodysplastic syndrome An unusual or allergic reaction to romiplostim, mannitol, other medications, foods, dyes, or preservatives Pregnant or trying to get pregnant Breast-feeding How should I use this medication? This medication is injected under the skin. It is given by a care team in a hospital or clinic setting. A special MedGuide will be given to you before each treatment. Be sure to read this information carefully each time. Talk to your care team about the use of this medication in children. While it may be prescribed for children as young as newborns for selected conditions, precautions do apply. Overdosage: If you think you have taken too much of this medicine contact a poison control center or emergency room at once. NOTE: This medicine is only for you. Do not share this medicine with others. What if I miss a dose? Keep appointments for follow-up doses. It is important not to miss your dose. Call your care team if you are unable to keep an appointment. What may interact with this medication? Interactions are not expected. This list may not describe all possible interactions. Give your health care provider a list of all the medicines, herbs, non-prescription drugs, or dietary supplements you use. Also  tell them if you smoke, drink alcohol, or use illegal drugs. Some items may interact with your medicine. What should I watch for while using this medication? Visit your care team for regular checks on your progress. You may need blood work done while you are taking this medication. Your condition will be monitored carefully while you are receiving this medication. It is important not to miss any appointments. What side effects may I notice from receiving this medication? Side effects that you should report to your care team as soon as possible: Allergic reactions--skin rash, itching, hives, swelling of the face, lips, tongue, or throat Blood clot--pain, swelling, or warmth in the leg, shortness of breath, chest pain Side effects that usually do not require medical attention (report to your care team if they continue or are bothersome): Dizziness Joint pain Muscle pain Pain in the hands or feet Stomach pain Trouble sleeping This list may not describe all possible side effects. Call your doctor for medical advice about side effects. You may report side effects to FDA at 1-800-FDA-1088. Where should I keep my medication? This medication is given in a hospital or clinic. It will not be stored at home. NOTE: This sheet is a summary. It may not cover all possible information. If you have questions about this medicine, talk to your doctor, pharmacist, or health care provider.  2023 Elsevier/Gold Standard (2022-02-28 00:00:00)  

## 2022-08-02 NOTE — Progress Notes (Signed)
[facility]  [address] [place]  [id number] [phone]  Clinic Day:  08/02/2022  Referring physician: Gordan PaymentGrisso, Greg A., MD  ASSESSMENT & PLAN:   Assessment & Plan: Immune thrombocytopenic purpura (HCC) Persistent thrombocytopenia despite pulse steroid dosing.  She does not tolerate steroid well.  Due to the severity of the thrombocytopenia, she had been getting frequent platelet transfusions.  She was therefore started on Nplate on July 27th and has good improvement in her thrombocytopenia.  Her platelets remain over 100,000.  We will continue Nplate weekly.  We will plan to see her back in 1 week as scheduled.   The patient understands the plans discussed today and is in agreement with them.  She knows to contact our office if she develops concerns prior to her next appointment.   I provided 10 minutes of face-to-face time during this encounter and > 50% was spent counseling as documented under my assessment and plan.    Adah PerlKelli A Aristidis Talerico, PA-C  [facility] [address] [place] [address] Dept: [phone] Dept Fax: [phone]   Orders Placed This Encounter  Procedures   CBC and differential    This external order was created through the Results Console.   CBC    This external order was created through the Results Console.   Basic metabolic panel    This external order was created through the Results Console.   Comprehensive metabolic panel    This external order was created through the Results Console.   Hepatic function panel    This external order was created through the Results Console.      CHIEF COMPLAINT:  CC: ITP  Current Treatment: Weekly Nplate  HISTORY OF PRESENT ILLNESS:  Maria Holloway is a 86 y.o. female with dementia with immune thrombocytopenic purpura.  We began seeing her on May 12th, at which time her platelet count was 31,000.  She  has a history of CVA in February, for which she was on aspirin 81 mg daily.  On April 6th, [facility] after a [facility] with significant bruising of her face.  She had mild thrombocytopenia with a platelet count of 119,000.  White blood count and hemoglobin were normal.  CT head did not reveal any intracranial injury.  There was a forehead hematoma without calvarial fracture. Chronic small vessel disease with infarcts was seen.  EKG revealed ST elevation consistent with an acute myocardial infarction.  [facility] and underwent cardiac catheterization with stenting of the right coronary artery.  Her folate level was mildly low at 5.6.  She was discharged home on aspirin 81 mg daily and Brilinta 90 mg twice daily.  [facility] on April 12th with general weakness and her platelet count remained low at 117,000.  Her hemoglobin was mildly low at 11.7 and bilirubin was 2.  She presented to the ER again on April 15th with restlessness and anxiety, which she had been experiencing since her hospitalization for MRI.  She had persistent thrombocytopenia with a platelet count of 117,000, but normal white count and hemoglobin. The bilirubin was 1.5.  No other specific concerns were found.  [facility] on April 28th with general weakness and dizziness. Her CT head did not reveal any acute abnormality.  CTA chest did not reveal acute pulmonary embolism.  There were multiple pulmonary nodules the largest measuring 2.8 cm.  Repeat CT or PET scan was recommended.  Small bilateral pleural effusions with mild interstitial edema was seen.  She also had elevation of her BNP.  She was admitted and diuresed.  Echocardiogram revealed mild concentric left ventricular hypertrophy with overall normal left ventricular function with an ejection fraction of 55 to 60%.  She had persistent thrombocytopenia with a platelet count of 66,000 to  79,00 during her 3-day admission.  Her hemoglobin fluctuated up and down but remained above 11.  White blood count remained normal and bilirubin was normal.  She saw Dr. Shary Decamp for follow-up on May 8th at which time her platelet count had dropped to 29,000, so she was referred here.  White blood cells, hemoglobin and bilirubin remained normal.  Dr. Allyson Sabal switched from [facility] to Plavix due to thrombocytopenia on May 13th.   Evaluation in our office revealed persistent mild folate deficiency with folic acid of 5.2.  B12 was normal.  There was no evidence of hemolysis.  Serum protein electrophoresis did not reveal a monoclonal protein.  We felt this was most consistent with immune thrombocytopenic purpura.  She was treated with pulse dexamethasone 12 mg daily for 5 days, which is a reduced dose, as we were concerned higher doses or treatment for an extended period of time would worsen her dementia and agitation.  She was also given haloperidol 1 mg every 8 hours to use as needed for agitation.  She tolerated dexamethasone better than expected, but did require Haldol fairly regularly.  Initially, her platelet count went up to 69,000, but since that time, it had steadily decreased.  As her platelet count remained above 20,000, we opted for observation due to difficulty tolerating the dexamethasone.  She also has had mild anemia.  She had been steadily losing weight.   Her platelets dropped down to 23,000 on June 29, so she was given another pulse dexamethasone at a reduced dose of 4 mg 3 times daily for 3 days due to previous poor tolerance.  Her husband was unable to bring her for her appointment the following week.  She presented to the emergency room on July 10 with near syncope, hypotension and dehydration.  Her platelet count was 14,000 despite the pulse of dexamethasone.  She was admitted for further management of her hypotension and dehydration.  She received 1 platelet pheresis and started on dexamethasone  20 mg daily.  She and her husband insisted on discharge later that day, so she was sent out on oral dexamethasone.  We arranged for her to have a platelet transfusion as an outpatient on July 12.  Her platelet count was up to 55,000 on July 13 after transfusion.  On July 20, the platelet count had dropped to 11,000, so she had another transfusion.  She had a fall in between visits and had bruising of her face.  On July 27, her platelet count remained 11,000, so she was transfused again.  Due to the persistent thrombocytopenia, she was started on Nplate injections on July 27.  On August 2, her platelet count was slightly better 17,000 and we held off on transfusion.  Her platelet count was up to 54,000 on August 9 and has been 100,000 since August 16.  Her general health has declined slowly due to dementia.  INTERVAL HISTORY:  Chaquetta is here today for repeat clinical assessment prior to Nplate.  Fortunately, she has not had any further falls this week.  She  is in a wheelchair today.  She denies any difficulties.  Her husband states that he started her back on clopidogrel, low-dose aspirin and atorvastatin last week.  She denies fevers or chills. She denies pain. Her appetite is decreased. Her weight has been stable.  Her husband reports her lump above her right arm.  REVIEW OF SYSTEMS:  Review of Systems  Constitutional:  Negative for appetite change, chills, fatigue, fever and unexpected weight change.  HENT:   Negative for lump/mass, mouth sores and sore throat.   Respiratory:  Negative for cough and shortness of breath.   Cardiovascular:  Negative for chest pain and leg swelling.  Gastrointestinal:  Negative for abdominal pain, constipation, diarrhea, nausea and vomiting.  Endocrine: Negative for hot flashes.  Genitourinary:  Negative for difficulty urinating, dysuria, frequency and hematuria.   Musculoskeletal:  Negative for arthralgias, back pain and myalgias.  Skin:  Negative for rash.   Neurological:  Negative for dizziness and headaches.  Hematological:  Negative for adenopathy. Does not bruise/bleed easily.  Psychiatric/Behavioral:  Negative for depression and sleep disturbance. The patient is not nervous/anxious.      VITALS:  Blood pressure (!) 140/76, pulse 76, temperature 97.6 F (36.4 C), temperature source Oral, resp. rate 18, height 5\' 5"  (1.651 m), weight 115 lb (52.2 kg), SpO2 95 %.  Wt Readings from Last 3 Encounters:  08/02/22 115 lb (52.2 kg)  07/26/22 115 lb 12.8 oz (52.5 kg)  07/19/22 117 lb 11.2 oz (53.4 kg)    Body mass index is 19.14 kg/m.  Performance status (ECOG): 2 - Symptomatic, <50% confined to bed  PHYSICAL EXAM:  Physical Exam Vitals and nursing note reviewed.  Constitutional:      General: She is not in acute distress.    Appearance: Normal appearance.  HENT:     Head: Normocephalic and atraumatic.     Mouth/Throat:     Mouth: Mucous membranes are moist.     Pharynx: Oropharynx is clear. No oropharyngeal exudate or posterior oropharyngeal erythema.  Eyes:     General: No scleral icterus.    Extraocular Movements: Extraocular movements intact.     Conjunctiva/sclera: Conjunctivae normal.     Pupils: Pupils are equal, round, and reactive to light.  Cardiovascular:     Rate and Rhythm: Normal rate and regular rhythm.     Heart sounds: Normal heart sounds. No murmur heard.    No friction rub. No gallop.  Pulmonary:     Effort: Pulmonary effort is normal.     Breath sounds: Normal breath sounds. No wheezing, rhonchi or rales.  Abdominal:     General: There is no distension.     Palpations: Abdomen is soft. There is no hepatomegaly, splenomegaly or mass.     Tenderness: There is no abdominal tenderness.  Musculoskeletal:        General: Normal range of motion.     Cervical back: Normal range of motion and neck supple. No tenderness.     Right lower leg: No edema.     Left lower leg: No edema.  Lymphadenopathy:     Cervical:  No cervical adenopathy.     Upper Body:     Right upper body: No supraclavicular or axillary adenopathy.     Left upper body: No supraclavicular or axillary adenopathy.  Skin:    General: Skin is warm and dry.     Coloration: Skin is not jaundiced.     Findings: Ecchymosis (Scattered ecchymoses in various stages of healing)  present. No rash.     Comments: There is a in the right distal arm with overall lying ecchymosis  Neurological:     Mental Status: She is alert and oriented to person, place, and time.     Cranial Nerves: No cranial nerve deficit.  Psychiatric:        Mood and Affect: Mood normal.        Behavior: Behavior normal.        Thought Content: Thought content normal.     LABS:      Latest Ref Rng & Units 08/02/2022   12:00 AM 07/26/2022   12:00 AM 07/19/2022   12:00 AM  CBC  WBC  6.5     5.5     5.3      Hemoglobin 12.0 - 16.0 11.4     13.0     11.4      Hematocrit 36 - 46 35     40     35      Platelets 150 - 400 K/uL 118     114     129         This result is from an external source.      Latest Ref Rng & Units 08/02/2022   12:00 AM 07/26/2022   12:00 AM 07/19/2022   12:00 AM  CMP  BUN 4 - C    11      Creatinine 0.5 - 1.1 0.5     0.6     0.6      Sodium 137 - 147 138     137  C    137      Potassium 3.5 - 5.1 mEq/L 3.6     3.9  C    3.6      Chloride 99 - 108 108     105  C    105      CO2 13 - C    26      Calcium 8.7 - 10.7 8.8     9.0  C    8.6      Alkaline Phos 25 - 125 173     89     164      AST 13 - 35 ALT 7 - 35 U/L C Corrected result   This result is from an external source.     No results found for: "CEA1", "CEA" / No results found for: "CEA1", "CEA" No results found for: "PSA1" No results found for: "EAV409" No results found for: "CAN125"  Lab Results  Component Value Date   TOTALPROTELP 6.0 04/14/2022   ALBUMINELP 3.5 04/14/2022   A1GS 0.3 04/14/2022    A2GS 0.5 04/14/2022   BETS 0.7 04/14/2022   GAMS 1.0 04/14/2022   MSPIKE Not Observed 04/14/2022   SPEI Comment 04/14/2022   No results found for: "TIBC", "FERRITIN", "IRONPCTSAT" Lab Results  Component Value Date   LDH 189 04/14/2022    STUDIES:  No results found.    HISTORY:   Past Medical History:  Diagnosis Date   Acute ST elevation myocardial infarction (STEMI) of inferior wall (HCC) 03/09/2022   Age-related osteoporosis without current pathological fracture 04/20/2021  Formatting of this note might be different from the original. Dexa 04/2021. At [facility].   Anxiety disorder 03/23/2021   Formatting of this note might be different from the original. Tried and failed escitalopram   At high risk for falls 01/23/2022   CAD (coronary artery disease)    a. s/p STEMI in 03/2022 with DES to prox/mid RCA.   Cerebral atherosclerosis    Delirium superimposed on dementia 03/10/2022   Essential hypertension    Fall 03/09/2022   Folate deficiency 04/27/2022   GERD without esophagitis 08/18/2021   History of arterial ischemic stroke 01/23/2022   Formatting of this note might be different from the original. 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/2021   Nocturia 09/26/2021   Osteoporosis    Primary osteoarthritis involving multiple joints 08/18/2021   Seborrheic keratosis 09/26/2021   Stroke (HCC)    Thrombocytopenia (HCC)    Vitamin B12 deficiency     Past Surgical History:  Procedure Laterality Date   CORONARY/GRAFT ACUTE MI REVASCULARIZATION N/A 03/09/2022   Procedure: Coronary/Graft Acute MI Revascularization;  Surgeon: Runell Gess, MD;  Location: [facility] INVASIVE CV LAB;  Service: Cardiovascular;  Laterality: N/A;   LEFT HEART CATH AND CORONARY ANGIOGRAPHY N/A 03/09/2022   Procedure: LEFT HEART CATH AND CORONARY ANGIOGRAPHY;  Surgeon: Runell Gess, MD;   Location: [facility] INVASIVE CV LAB;  Service: Cardiovascular;  Laterality: N/A;    Family History  Problem Relation Age of Onset   Stroke Mother    CAD Father    Heart attack Father     Social History:  reports that she has never smoked. She has never used smokeless tobacco. She reports current alcohol use. She reports that she does not use drugs.The patient is accompanied by her husband today.  Allergies: No Known Allergies  Current Medications: Current Outpatient Medications  Medication Sig Dispense Refill   aspirin EC 81 MG tablet Take 81 mg by mouth daily. Swallow whole.     carvedilol (COREG) 6.25 MG tablet Take 6.25 mg by mouth 2 (two) times daily.     carvedilol (COREG) 6.25 MG tablet Take by mouth.     clopidogrel (PLAVIX) 75 MG tablet Take 75 mg by mouth daily.     cyanocobalamin 1000 MCG tablet Take 1,000 mcg by mouth daily.     dexamethasone (DECADRON) 4 MG tablet Take 1 tablet (4 mg total) by mouth 3 (three) times daily. 9 tablet 1   donepezil (ARICEPT) 5 MG tablet Take 5 mg by mouth daily.     Folic Acid (FOLATE PO)      furosemide (LASIX) 20 MG tablet Take 20 mg by mouth as needed for fluid or edema.     haloperidol (HALDOL) 1 MG tablet TAKE 1 TABLET (1 MG TOTAL) BY MOUTH EVERY 8 (EIGHT) HOURS AS NEEDED FOR AGITATION. 60 tablet 1   LORazepam (ATIVAN) 1 MG tablet Take 1 tablet (1 mg total) by mouth every 8 (eight) hours. 30 tablet 0   losartan (COZAAR) 50 MG tablet Take 50 mg by mouth daily.     methocarbamol (ROBAXIN) 500 MG tablet Take 500 mg by mouth.     metoprolol tartrate (LOPRESSOR) 25 MG tablet Take 1 tablet (25 mg total) by mouth 2 (two) times daily. 180 tablet 1   nitroGLYCERIN (NITROSTAT) 0.4 MG SL tablet Place 1 tablet (0.4 mg total) under the tongue  every 5 (five) minutes x 3 doses as needed for chest pain. 25 tablet 3   pantoprazole (PROTONIX) 40 MG tablet Take 1 tablet (40 mg total) by mouth daily. 90 tablet 1   sertraline (ZOLOFT) 50 MG tablet Take 50 mg by  mouth daily.     No current facility-administered medications for this visit.

## 2022-08-02 NOTE — Assessment & Plan Note (Signed)
Persistent thrombocytopenia despite pulse steroid dosing.  She does not tolerate steroid well.  Due to the severity of the thrombocytopenia, she had been getting frequent platelet transfusions.  She was therefore started on Nplate on July 27th and has good improvement in her thrombocytopenia.  Her platelets remain over 100,000.  We will continue Nplate weekly.  We will plan to see her back in 1 week as scheduled.

## 2022-08-09 ENCOUNTER — Inpatient Hospital Stay: Payer: Medicare Other

## 2022-08-09 ENCOUNTER — Telehealth: Payer: Self-pay

## 2022-08-09 ENCOUNTER — Inpatient Hospital Stay: Payer: Medicare Other | Attending: Hematology and Oncology | Admitting: Oncology

## 2022-08-09 ENCOUNTER — Encounter: Payer: Self-pay | Admitting: Oncology

## 2022-08-09 ENCOUNTER — Other Ambulatory Visit: Payer: Self-pay | Admitting: Oncology

## 2022-08-09 VITALS — BP 121/61 | HR 76 | Temp 97.5°F | Resp 17 | Ht 65.0 in | Wt 112.8 lb

## 2022-08-09 DIAGNOSIS — Z79899 Other long term (current) drug therapy: Secondary | ICD-10-CM | POA: Diagnosis not present

## 2022-08-09 DIAGNOSIS — D693 Immune thrombocytopenic purpura: Secondary | ICD-10-CM

## 2022-08-09 LAB — BASIC METABOLIC PANEL
BUN: 18 (ref 4–21)
CO2: 25 — AB (ref 13–22)
Chloride: 107 (ref 99–108)
Creatinine: 0.7 (ref 0.5–1.1)
Glucose: 152
Potassium: 3.2 mEq/L — AB (ref 3.5–5.1)
Sodium: 140 (ref 137–147)

## 2022-08-09 LAB — CBC AND DIFFERENTIAL
HCT: 36 (ref 36–46)
Hemoglobin: 11.8 — AB (ref 12.0–16.0)
Neutrophils Absolute: 4.55
Platelets: 110 10*3/uL — AB (ref 150–400)
WBC: 6.5

## 2022-08-09 LAB — HEPATIC FUNCTION PANEL
ALT: 21 U/L (ref 7–35)
AST: 31 (ref 13–35)
Alkaline Phosphatase: 156 — AB (ref 25–125)
Bilirubin, Total: 1.2

## 2022-08-09 LAB — COMPREHENSIVE METABOLIC PANEL
Albumin: 4 (ref 3.5–5.0)
Calcium: 9.2 (ref 8.7–10.7)

## 2022-08-09 LAB — CBC: RBC: 4.25 (ref 3.87–5.11)

## 2022-08-09 MED ORDER — ROMIPLOSTIM 250 MCG ~~LOC~~ SOLR
3.1000 ug/kg | Freq: Once | SUBCUTANEOUS | Status: AC
  Administered 2022-08-09: 160 ug via SUBCUTANEOUS
  Filled 2022-08-09: qty 0.32

## 2022-08-09 NOTE — Progress Notes (Signed)
[facility]  [address] [place],  [place]  [address] [phone]  Clinic Day:  08/09/22  Referring physician: Gordan Payment., MD  ASSESSMENT & PLAN:   Assessment & Plan: Immune thrombocytopenic purpura (HCC) Persistent thrombocytopenia despite pulse steroid dosing.  She did not tolerate steroids well.  Due to the severity of the thrombocytopenia, she had been getting frequent platelet transfusions.  She was therefore started on Nplate on July 27th and has good improvement in her thrombocytopenia.  Her platelets remain over 100,000.    Even though the patient is responding to the injections for her thrombocytopenia, she continues to deteriorate in every other way.  Her dementia is worse and she is nonverbal today she continues to lose weight and will be starting on hospice tomorrow.  We therefore we will stop her atorvastatin, Plavix, Lasix, losartan, and methocarbamol.  I told her to stop the aspirin in 2 weeks as her platelet count will start to fall.  They will stop the Aricept after they run out of this bottle.  I will put her on Haldol 1 to 2 mg every 4 as needed.  She will stay on the Zoloft and the Protonix and the low-dose of Coreg of 6.25.  We will keep the metoprolol for now but will probably drop that soon in view of her low blood pressure.  I will not schedule further appointments and the husband was disappointed, but I think it is an ordeal for them to get in here.  She constantly wants to leave when she gets here.  I will be glad to see her as needed.  The husband understands the plans discussed today and is in agreement with them.  He knows to contact our office if she develops concerns  I provided 30 minutes of face-to-face time during this encounter and > 50% was spent counseling as documented under my assessment and plan.    Dellia Beckwith, MD  [facility] [address]  [place] [address] Dept: [phone] Dept Fax: [phone]   Orders Placed This Encounter  Procedures   CBC and differential    This external order was created through the Results Console.   CBC    This external order was created through the Results Console.   Basic metabolic panel    This external order was created through the Results Console.   Comprehensive metabolic panel    This external order was created through the Results Console.   Hepatic function panel    This external order was created through the Results Console.      CHIEF COMPLAINT:  CC: ITP  Current Treatment: Weekly Nplate  HISTORY OF PRESENT ILLNESS:  Maria Holloway is a 86 y.o. female with dementia with immune thrombocytopenic purpura.  We began seeing her on May 12th, at which time her platelet count was 31,000.  She has a history of CVA in February, for which she was on aspirin 81 mg daily.  On April 6th, she presented to [facility] ER after a fall with significant bruising of her face.  She had mild thrombocytopenia with a platelet count of 119,000.  White blood count and hemoglobin were normal.  CT head did not reveal any intracranial injury.  There was a forehead hematoma without calvarial fracture. Chronic small vessel disease with infarcts was seen.  EKG revealed ST elevation consistent with an acute myocardial infarction.  She was transferred to  [place] and underwent cardiac catheterization with stenting of the right coronary artery.  Her folate level was mildly low at 5.6.  She was discharged home on aspirin 81 mg daily and Brilinta 90 mg twice daily.  She was seen at the [facility] ER again on April 12th with general weakness and her platelet count remained low at 117,000.  Her hemoglobin was mildly low at 11.7 and bilirubin was 2.  She presented to the ER again on April 15th with restlessness and anxiety, which she had been experiencing since her hospitalization for MRI.  She had  persistent thrombocytopenia with a platelet count of 117,000, but normal white count and hemoglobin. The bilirubin was 1.5.  No other specific concerns were found.  She presented to the [facility] ER again on April 28th with general weakness and dizziness. Her CT head did not reveal any acute abnormality.  CTA chest did not reveal acute pulmonary embolism.  There were multiple pulmonary nodules the largest measuring 2.8 cm.  Repeat CT or PET scan was recommended.  Small bilateral pleural effusions with mild interstitial edema was seen.  She also had elevation of her BNP.  She was admitted and diuresed.  Echocardiogram revealed mild concentric left ventricular hypertrophy with overall normal left ventricular function with an ejection fraction of 55 to 60%.  She had persistent thrombocytopenia with a platelet count of 66,000 to 79,00 during her 3-day admission.  Her hemoglobin fluctuated up and down but remained above 11.  White blood count remained normal and bilirubin was normal.  She saw Dr. Shary Decamp for follow-up on May 8th at which time her platelet count had dropped to 29,000, so she was referred here.  White blood cells, hemoglobin and bilirubin remained normal.  Dr. Allyson Sabal switched from [facility] to Plavix due to thrombocytopenia on May 13th.   Evaluation in our office revealed persistent mild folate deficiency with folic acid of 5.2.  B12 was normal.  There was no evidence of hemolysis.  Serum protein electrophoresis did not reveal a monoclonal protein.  We felt this was most consistent with immune thrombocytopenic purpura.  She was treated with pulse dexamethasone 12 mg daily for 5 days, which is a reduced dose, as we were concerned higher doses or treatment for an extended period of time would worsen her dementia and agitation.  She was also given haloperidol 1 mg every 8 hours to use as needed for agitation.  She tolerated dexamethasone better than expected, but did require Haldol fairly regularly.   Initially, her platelet count went up to 69,000, but since that time, it had steadily decreased.  As her platelet count remained above 20,000, we opted for observation due to difficulty tolerating the dexamethasone.  She also has had mild anemia.  She had been steadily losing weight.   Her platelets dropped down to 23,000 on June 29, so she was given another pulse dexamethasone at a reduced dose of 4 mg 3 times daily for 3 days due to previous poor tolerance.  Her husband was unable to bring her for her appointment the following week.  She presented to the emergency room on July 10 with near syncope, hypotension and dehydration.  Her platelet count was 14,000 despite the pulse of dexamethasone.  She was admitted for further management of her hypotension and dehydration.  She received 1 platelet pheresis and started on dexamethasone 20 mg daily.  She and her husband insisted on discharge later that day, so she was sent out on oral  dexamethasone.  We arranged for her to have a platelet transfusion as an outpatient on July 12.  Her platelet count was up to 55,000 on July 13 after transfusion.  On July 20, the platelet count had dropped to 11,000, so she had another transfusion.  She had a fall in between visits and had bruising of her face.  On July 27, her platelet count remained 11,000, so she was transfused again.  Due to the persistent thrombocytopenia, she was started on Nplate injections on July 27.  On August 2, her platelet count was slightly better 17,000 and we held off on transfusion.  Her platelet count was up to 54,000 on August 9 and has been 100,000 since August 16.  Her general health has declined slowly due to dementia.  INTERVAL HISTORY:  Maria Holloway is here today for repeat clinical assessment prior to Nplate.  Fortunately, she has not had any further falls this week.  She is in a wheelchair today. Her husband states that he started her back on clopidogrel, low-dose aspirin and atorvastatin last  week.  We will stop this now since she is going on hospice.  She refuses to eat and he has to force her.  She is not verbal today.  She denies fevers or chills. She denies pain. Her appetite is decreased. Her weight is down 2 pounds in 1 week's time.  Labs reveal a low potassium of 3.2 but hopefully this will improve since we are stopping the Lasix.  CBC reveals a platelet count of 110,000 and hemoglobin of 11.8.  REVIEW OF SYSTEMS:  Review of Systems  Constitutional:  Negative for appetite change, chills, fatigue, fever and unexpected weight change.  HENT:   Negative for lump/mass, mouth sores and sore throat.   Respiratory:  Negative for cough and shortness of breath.   Cardiovascular:  Negative for chest pain and leg swelling.  Gastrointestinal:  Negative for abdominal pain, constipation, diarrhea, nausea and vomiting.  Endocrine: Negative for hot flashes.  Genitourinary:  Negative for difficulty urinating, dysuria, frequency and hematuria.   Musculoskeletal:  Negative for arthralgias, back pain and myalgias.  Skin:  Negative for rash.  Neurological:  Negative for dizziness and headaches.  Hematological:  Negative for adenopathy. Does not bruise/bleed easily.  Psychiatric/Behavioral:  Negative for depression and sleep disturbance. The patient is not nervous/anxious.      VITALS:  Blood pressure 121/61, pulse 76, temperature (!) 97.5 F (36.4 C), temperature source Oral, resp. rate 17, height 5\' 5"  (1.651 m), weight 112 lb 12.8 oz (51.2 kg), SpO2 96 %.  Wt Readings from Last 3 Encounters:  08/09/22 112 lb 12.8 oz (51.2 kg)  08/02/22 115 lb (52.2 kg)  07/26/22 115 lb 12.8 oz (52.5 kg)    Body mass index is 18.77 kg/m.  Performance status (ECOG): 2 - Symptomatic, <50% confined to bed  PHYSICAL EXAM:  Physical Exam Vitals and nursing note reviewed.  Constitutional:      General: She is not in acute distress.    Appearance: Normal appearance.  HENT:     Head: Normocephalic and  atraumatic.     Mouth/Throat:     Mouth: Mucous membranes are moist.     Pharynx: Oropharynx is clear. No oropharyngeal exudate or posterior oropharyngeal erythema.  Eyes:     General: No scleral icterus.    Extraocular Movements: Extraocular movements intact.     Conjunctiva/sclera: Conjunctivae normal.     Pupils: Pupils are equal, round, and reactive to light.  Cardiovascular:  Rate and Rhythm: Normal rate and regular rhythm.     Heart sounds: Normal heart sounds. No murmur heard.    No friction rub. No gallop.  Pulmonary:     Effort: Pulmonary effort is normal.     Breath sounds: Normal breath sounds. No wheezing, rhonchi or rales.  Abdominal:     General: There is no distension.     Palpations: Abdomen is soft. There is no hepatomegaly, splenomegaly or mass.     Tenderness: There is no abdominal tenderness.  Musculoskeletal:        General: Normal range of motion.     Cervical back: Normal range of motion and neck supple. No tenderness.     Right lower leg: No edema.     Left lower leg: No edema.  Lymphadenopathy:     Cervical: No cervical adenopathy.     Upper Body:     Right upper body: No supraclavicular or axillary adenopathy.     Left upper body: No supraclavicular or axillary adenopathy.  Skin:    General: Skin is warm and dry.     Coloration: Skin is not jaundiced.     Findings: Ecchymosis (Scattered ecchymoses in various stages of healing) present. No rash.     Comments: There is a in the right distal arm with overall lying ecchymosis  Neurological:     Mental Status: She is alert and oriented to person, place, and time.     Cranial Nerves: No cranial nerve deficit.  Psychiatric:        Mood and Affect: Mood normal.        Behavior: Behavior normal.        Thought Content: Thought content normal.     LABS:      Latest Ref Rng & Units 08/09/2022   12:00 AM 08/02/2022   12:00 AM 07/26/2022   12:00 AM  CBC  WBC  6.5     6.5     5.5      Hemoglobin 12.0 -  16.0 11.8     11.4     13.0      Hematocrit 36 - 46 36     35     40      Platelets 150 - 400 K/uL 110     118     114         This result is from an external source.      Latest Ref Rng & Units 08/09/2022   12:00 AM 08/02/2022   12:00 AM 07/26/2022   12:00 AM  CMP  BUN 4 - 21 18     15     11   C     Creatinine 0.5 - 1.1 0.7     0.5     0.6      Sodium 137 - 147 140     138     137  C     Potassium 3.5 - 5.1 mEq/L 3.2     3.6     3.9  C     Chloride 99 - 108 107     108     105  C     CO2 13 - 22 25     24     24   C     Calcium 8.7 - 10.7 9.2     8.8     9.0  C     Alkaline Phos 25 - 125 156  173     89      AST 13 - 35 31     23     23       ALT 7 - 35 U/L 21     18     16         C Corrected result   This result is from an external source.     No results found for: "CEA1", "CEA" / No results found for: "CEA1", "CEA" No results found for: "PSA1" No results found for: " " No results found for: "CAN125"  Lab Results  Component Value Date   TOTALPROTELP 6.0 04/14/2022   ALBUMINELP 3.5 04/14/2022   A1GS 0.3 04/14/2022   A2GS 0.5 04/14/2022   BETS 0.7 04/14/2022   GAMS 1.0 04/14/2022   MSPIKE Not Observed 04/14/2022   SPEI Comment 04/14/2022   No results found for: "TIBC", "FERRITIN", "IRONPCTSAT" Lab Results  Component Value Date   LDH 189 04/14/2022    STUDIES:  No results found.    HISTORY:   Past Medical History:  Diagnosis Date   Acute ST elevation myocardial infarction (STEMI) of inferior wall (HCC) 03/09/2022   Age-related osteoporosis without current pathological fracture 04/20/2021   Formatting of this note might be different from the original. Dexa 04/2021. At [facility].   Anxiety disorder 03/23/2021   Formatting of this note might be different from the original. Tried and failed escitalopram   At high risk for falls 01/23/2022   CAD (coronary artery disease)    a. s/p STEMI in 03/2022 with DES to prox/mid RCA.   Cerebral atherosclerosis    Delirium  superimposed on dementia 03/10/2022   Essential hypertension    Fall 03/09/2022   Folate deficiency 04/27/2022   GERD without esophagitis 08/18/2021   History of arterial ischemic stroke 01/23/2022   Formatting of this note might be different from the original. 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/2021   Nocturia 09/26/2021   Osteoporosis    Primary osteoarthritis involving multiple joints 08/18/2021   Seborrheic keratosis 09/26/2021   Stroke (HCC)    Thrombocytopenia (HCC)    Vitamin B12 deficiency     Past Surgical History:  Procedure Laterality Date   CORONARY/GRAFT ACUTE MI REVASCULARIZATION N/A 03/09/2022   Procedure: Coronary/Graft Acute MI Revascularization;  Surgeon: 09/28/2021, MD;  Location: [facility] INVASIVE CV LAB;  Service: Cardiovascular;  Laterality: N/A;   LEFT HEART CATH AND CORONARY ANGIOGRAPHY N/A 03/09/2022   Procedure: LEFT HEART CATH AND CORONARY ANGIOGRAPHY;  Surgeon: Runell Gess, MD;  Location: [facility] INVASIVE CV LAB;  Service: Cardiovascular;  Laterality: N/A;    Family History  Problem Relation Age of Onset   Stroke Mother    CAD Father    Heart attack Father     Social History:  reports that she has never smoked. She has never used smokeless tobacco. She reports current alcohol use. She reports that she does not use drugs.The patient is accompanied by her husband today.  Allergies: No Known Allergies  Current Medications: Current Outpatient Medications  Medication Sig Dispense Refill   acetaminophen (TYLENOL) 325 MG tablet Take 2 tablets (650 mg total) by mouth every 6 (six) hours as needed. 100 tablet 0   aspirin EC 81 MG tablet Take 81 mg by mouth daily. Swallow whole.     carvedilol (COREG) 6.25 MG tablet Take  6.25 mg by mouth 2 (two) times daily.     cyanocobalamin 1000 MCG tablet Take 1,000 mcg by mouth daily.     donepezil  (ARICEPT) 5 MG tablet Take 5 mg by mouth daily.     Folic Acid (FOLATE PO)      haloperidol (HALDOL) 1 MG tablet TAKE 1 TABLET (1 MG TOTAL) BY MOUTH EVERY 8 (EIGHT) HOURS AS NEEDED FOR AGITATION. 60 tablet 1   LORazepam (ATIVAN) 1 MG tablet Take 1 tablet (1 mg total) by mouth every 8 (eight) hours. 30 tablet 0   metoprolol tartrate (LOPRESSOR) 25 MG tablet Take 1 tablet (25 mg total) by mouth 2 (two) times daily. 180 tablet 1   nitroGLYCERIN (NITROSTAT) 0.4 MG SL tablet Place 1 tablet (0.4 mg total) under the tongue every 5 (five) minutes x 3 doses as needed for chest pain. 25 tablet 3   pantoprazole (PROTONIX) 40 MG tablet Take 1 tablet (40 mg total) by mouth daily. 90 tablet 1   QUEtiapine (SEROQUEL) 25 MG tablet Take 1 tablet (25 mg total) by mouth at bedtime. 30 tablet 5   senna-docusate (SENNA S) 8.6-50 MG tablet Take 2 tablets by mouth at bedtime as needed for mild constipation. 30 tablet 1   sertraline (ZOLOFT) 50 MG tablet Take 50 mg by mouth daily.     No current facility-administered medications for this visit.

## 2022-08-09 NOTE — Telephone Encounter (Signed)
-----   Message from Dellia Beckwith, MD sent at 08/09/2022  6:58 AM EDT ----- Yes, I can be the attending, means we will have to stop Nplate injections. I don't know if he will want one more this week, I am seeing her today ----- Message ----- From: Jeannette Corpus, LPN Sent: 0/02/2121   2:30 PM EDT To: Dellia Beckwith, MD  Drenda Freeze from [facility] called stating that husband is wanting to transition patient to hospice out of palliative care on Thursday 08/10/22. He wants to keep scheduled appointments this week. Also Drenda Freeze wanted to know if you would be the attending physician?

## 2022-08-09 NOTE — Patient Instructions (Signed)
Romiplostim Injection What is this medication? ROMIPLOSTIM (roe mi PLOE stim) treats low levels of platelets in your body caused by immune thrombocytopenia (ITP). It is prescribed when other medications have not worked or cannot be tolerated. It may also be used to help people who have been exposed to high doses of radiation. It works by increasing the amount of platelets in your blood. This lowers the risk of bleeding. This medicine may be used for other purposes; ask your health care provider or pharmacist if you have questions. COMMON BRAND NAME(S): Nplate What should I tell my care team before I take this medication? They need to know if you have any of these conditions: Blood clots Myelodysplastic syndrome An unusual or allergic reaction to romiplostim, mannitol, other medications, foods, dyes, or preservatives Pregnant or trying to get pregnant Breast-feeding How should I use this medication? This medication is injected under the skin. It is given by a care team in a hospital or clinic setting. A special MedGuide will be given to you before each treatment. Be sure to read this information carefully each time. Talk to your care team about the use of this medication in children. While it may be prescribed for children as young as newborns for selected conditions, precautions do apply. Overdosage: If you think you have taken too much of this medicine contact a poison control center or emergency room at once. NOTE: This medicine is only for you. Do not share this medicine with others. What if I miss a dose? Keep appointments for follow-up doses. It is important not to miss your dose. Call your care team if you are unable to keep an appointment. What may interact with this medication? Interactions are not expected. This list may not describe all possible interactions. Give your health care provider a list of all the medicines, herbs, non-prescription drugs, or dietary supplements you use. Also  tell them if you smoke, drink alcohol, or use illegal drugs. Some items may interact with your medicine. What should I watch for while using this medication? Visit your care team for regular checks on your progress. You may need blood work done while you are taking this medication. Your condition will be monitored carefully while you are receiving this medication. It is important not to miss any appointments. What side effects may I notice from receiving this medication? Side effects that you should report to your care team as soon as possible: Allergic reactions--skin rash, itching, hives, swelling of the face, lips, tongue, or throat Blood clot--pain, swelling, or warmth in the leg, shortness of breath, chest pain Side effects that usually do not require medical attention (report to your care team if they continue or are bothersome): Dizziness Joint pain Muscle pain Pain in the hands or feet Stomach pain Trouble sleeping This list may not describe all possible side effects. Call your doctor for medical advice about side effects. You may report side effects to FDA at 1-800-FDA-1088. Where should I keep my medication? This medication is given in a hospital or clinic. It will not be stored at home. NOTE: This sheet is a summary. It may not cover all possible information. If you have questions about this medicine, talk to your doctor, pharmacist, or health care provider.  2023 Elsevier/Gold Standard (2022-02-28 00:00:00)  

## 2022-08-09 NOTE — Telephone Encounter (Signed)
Left message for Drenda Freeze at [facility] [place].

## 2022-08-14 ENCOUNTER — Other Ambulatory Visit: Payer: Self-pay | Admitting: Oncology

## 2022-08-14 ENCOUNTER — Telehealth: Payer: Self-pay

## 2022-08-14 DIAGNOSIS — F064 Anxiety disorder due to known physiological condition: Secondary | ICD-10-CM

## 2022-08-14 MED ORDER — QUETIAPINE FUMARATE 25 MG PO TABS: 25.0000 mg | ORAL_TABLET | Freq: Every day | ORAL | 5 refills | Status: DC

## 2022-08-14 NOTE — Telephone Encounter (Signed)
Received message on nurse call line requesting some orders for pt. Today is the first day, [name],RN, has seen pt. She wants to get orders for the medications pt is using already in the home (tylenol, Miralax), add senna, and requested increase in her haldol or maybe adding Seroquel or Risperdal.  She has also had 2 falls since Friday, 08/11/2022. The fall on Friday left her with right hand being swollen. This morning, no injury - she just slid out of the bed.  Above information sent to Dr Gilman Buttner @ [id number].

## 2022-08-15 ENCOUNTER — Other Ambulatory Visit: Payer: Self-pay

## 2022-08-15 MED ORDER — SENNOSIDES-DOCUSATE SODIUM 8.6-50 MG PO TABS: 2.0000 | ORAL_TABLET | Freq: Every evening | ORAL | 1 refills | Status: DC | PRN

## 2022-08-15 MED ORDER — ACETAMINOPHEN 325 MG PO TABS: 650.0000 mg | ORAL_TABLET | Freq: Four times a day (QID) | ORAL | 0 refills | Status: DC | PRN

## 2022-08-16 ENCOUNTER — Other Ambulatory Visit: Payer: Medicare Other

## 2022-08-16 ENCOUNTER — Ambulatory Visit: Payer: Medicare Other | Admitting: Hematology and Oncology

## 2022-08-16 ENCOUNTER — Ambulatory Visit: Payer: Medicare Other

## 2022-08-16 ENCOUNTER — Inpatient Hospital Stay: Payer: Medicare Other

## 2022-08-18 ENCOUNTER — Telehealth: Payer: Self-pay

## 2022-08-23 ENCOUNTER — Ambulatory Visit: Payer: Medicare Other

## 2022-08-23 ENCOUNTER — Other Ambulatory Visit: Payer: Medicare Other

## 2022-08-23 ENCOUNTER — Ambulatory Visit: Payer: Medicare Other | Admitting: Oncology

## 2022-08-30 ENCOUNTER — Ambulatory Visit: Payer: Medicare Other | Admitting: Hematology and Oncology

## 2022-08-30 ENCOUNTER — Ambulatory Visit: Payer: Medicare Other

## 2022-08-30 ENCOUNTER — Other Ambulatory Visit: Payer: Medicare Other

## 2022-09-01 ENCOUNTER — Telehealth: Payer: Self-pay

## 2022-09-01 NOTE — Telephone Encounter (Addendum)
Hospice nurse called to req a change in Haldol order from (1mg  po q 4hr) to 0.5mg -1mg  q 4hr PRN.  Pt's spouse, told nurse "she's either wild or a zombie. If I give her a haldol she sleeps too much. If I don't give her the haldol, she is agitated". Hospice nurse called to req a change in Haldol order from (1mg  po q 4hr) to 0.5mg -1mg  q 4hr PRN. She also a fall 5-6 days ago. He said he was trying to weigh her and she fell. She has a lot of purple bruising from buttocks to back of thigh. He didn't tell Hospice nurse until today.  I sent the above message to Dr Hinton Rao @ [phone]. Dr Hinton Rao agreed to the Haldol dosage change.

## 2022-09-04 ENCOUNTER — Other Ambulatory Visit: Payer: Self-pay | Admitting: Student

## 2022-09-05 ENCOUNTER — Other Ambulatory Visit: Payer: Self-pay

## 2022-09-05 MED ORDER — PANTOPRAZOLE SODIUM 40 MG PO TBEC: 40.0000 mg | DELAYED_RELEASE_TABLET | Freq: Every day | ORAL | 1 refills | Status: DC

## 2022-09-06 ENCOUNTER — Encounter: Payer: Self-pay | Admitting: Cardiology

## 2022-09-06 ENCOUNTER — Other Ambulatory Visit: Payer: Self-pay | Admitting: Oncology

## 2022-09-06 DIAGNOSIS — F064 Anxiety disorder due to known physiological condition: Secondary | ICD-10-CM

## 2022-09-07 ENCOUNTER — Telehealth: Payer: Self-pay

## 2022-09-07 NOTE — Telephone Encounter (Signed)
Hospice nurse called to make [place] aware that pt is coming to ER via EMS - fall w/gash in head. She has been falling frequently. Dr Hinton Rao notified.

## 2022-09-08 ENCOUNTER — Telehealth: Payer: Self-pay

## 2022-09-08 NOTE — Telephone Encounter (Signed)
[name],RN, w/Hospice, called to request orders to remove sutures in 7-10 days. Also, to change dressing from qd to twice weekly.  Dr Hinton Rao agreed to above.

## 2022-09-15 ENCOUNTER — Telehealth: Payer: Self-pay

## 2022-09-15 NOTE — Telephone Encounter (Signed)
[name],Rn, w/Hospice called to make Dr Hinton Rao aware that pt fell twice yesterday @ home. No injuries noted. She also removed the sutures from pt's head (previous fall required ER visit and sutures). She applied steri strips to area, as there was a small amt of bleeding, telfa, and tegaderm.

## 2022-09-26 ENCOUNTER — Other Ambulatory Visit: Payer: Self-pay | Admitting: Hematology and Oncology

## 2022-09-26 ENCOUNTER — Telehealth: Payer: Self-pay

## 2022-09-26 MED ORDER — PANTOPRAZOLE SODIUM 40 MG PO TBEC: 40.0000 mg | DELAYED_RELEASE_TABLET | Freq: Every day | ORAL | 1 refills | Status: DC

## 2022-09-26 MED ORDER — LORATADINE 10 MG PO TABS: 10.0000 mg | ORAL_TABLET | Freq: Every day | ORAL | 1 refills | Status: DC | PRN

## 2022-09-26 NOTE — Telephone Encounter (Addendum)
RE: Hospice orders, fall notification Received: Today Mosher, Beryle Flock, RN Thank you, done   ----- Message from Marvia Pickles, PA-C sent at 09/26/2022 12:40 PM EDT ----- Regarding: RE: Hospice orders, fall notification OK for orders below. Is she taking pantoprazole once or twice a day. Last rx written for 60 tab. Do I need to send in the other meds? ----- Message ----- From: Dairl Ponder, RN Sent: 09/26/2022  11:58 AM EDT To: Marvia Pickles, PA-C Subject: Hospice orders, fall notification              Received call from [place], [place]. Pt had fall on the 21st, no injury. Pt was very anxious and agitated yesterday. Pt's spouse gave her benadryl 50mg  and "snowed her and it scared him". [name] asked him to please call them before giving her anything extra. She requests to change a few orders. 1. Haldol was 0.5 -1 mg tab po q 4hr PRN. Would now like order to be Haldol 1mg  tabs 1-2 tabs po q 4hr PRN agitation/anxiety. 2. Senna plus 1-2 tabs po BID PRN. 3 Loratadine 10mg  po qd PRN allergies (please send prescription for). 4. Refill the pantoprazole.  [phone]

## 2022-10-10 ENCOUNTER — Telehealth: Payer: Self-pay

## 2022-10-10 NOTE — Telephone Encounter (Signed)
[name],RN, called to report the fall. She states the patient had just gotten out of bed. As she was walking, she lost her balance and hit her head a door. No injury per spouse. [name],RN, is going out to see pt today, routine appt.

## 2022-10-11 ENCOUNTER — Other Ambulatory Visit: Payer: Self-pay

## 2022-10-11 MED ORDER — SERTRALINE HCL 50 MG PO TABS: 50.0000 mg | ORAL_TABLET | Freq: Every day | ORAL | 1 refills | Status: DC

## 2022-10-11 MED ORDER — HALOPERIDOL 1 MG PO TABS: 0.5000 mg | ORAL_TABLET | ORAL | 1 refills | Status: DC | PRN

## 2022-10-18 ENCOUNTER — Other Ambulatory Visit: Payer: Self-pay | Admitting: Hematology and Oncology

## 2022-10-18 MED ORDER — SENNOSIDES-DOCUSATE SODIUM 8.6-50 MG PO TABS: 2.0000 | ORAL_TABLET | Freq: Every evening | ORAL | 1 refills | Status: DC | PRN

## 2022-10-19 ENCOUNTER — Telehealth: Payer: Self-pay

## 2022-10-19 ENCOUNTER — Other Ambulatory Visit: Payer: Self-pay | Admitting: Hematology and Oncology

## 2022-10-19 NOTE — Telephone Encounter (Signed)
[name],RN, called to report that pt fell this morning, getting out of bed. No injury.

## 2022-10-20 ENCOUNTER — Other Ambulatory Visit: Payer: Self-pay | Admitting: Hematology and Oncology

## 2022-10-20 MED ORDER — CARVEDILOL 6.25 MG PO TABS: 6.2500 mg | ORAL_TABLET | Freq: Two times a day (BID) | ORAL | 1 refills | Status: DC

## 2022-10-22 ENCOUNTER — Telehealth: Payer: Self-pay

## 2022-10-22 NOTE — Telephone Encounter (Signed)
-----   Message from Adah Perl, PA-C sent at 10/20/2022  2:29 PM EST ----- Regarding: RE: refills Coreg refill sent in, thank you ----- Message ----- From: Dyane Dustman, RN Sent: 10/20/2022   2:06 PM EST To: Adah Perl, PA-C Subject: RE: refills                                     She is only taking the coreg.  Sonya sent me an updated medication list and I have updated EPIC   ----- Message ----- From: Adah Perl, PA-C Sent: 10/18/2022   5:10 PM EST To: Dyane Dustman, RN Subject: RE: refills                                    I need to know if patient is taking both please. Thanks ----- Message ----- From: Dyane Dustman, RN Sent: 10/18/2022   4:49 PM EST To: Adah Perl, PA-C Subject: RE: refills                                    [name] specifically asked for coreg.    ----- Message ----- From: Adah Perl, PA-C Sent: 10/18/2022   4:36 PM EST To: Dyane Dustman, RN Subject: RE: refills                                    Is patient on Toprol and Coreg? Both beta blockers. We did not originally prescribe. Please clarify. Thanks ----- Message ----- From: Dyane Dustman, RN Sent: 10/18/2022   3:47 PM EST To: Adah Perl, PA-C Subject: refills                                        [name] with hospice called.  She needs refills sent for coreg and senna to [facility].

## 2022-10-30 ENCOUNTER — Telehealth: Payer: Self-pay

## 2022-10-30 ENCOUNTER — Other Ambulatory Visit: Payer: Self-pay

## 2022-10-30 DIAGNOSIS — G3184 Mild cognitive impairment, so stated: Secondary | ICD-10-CM

## 2022-10-30 DIAGNOSIS — F419 Anxiety disorder, unspecified: Secondary | ICD-10-CM

## 2022-10-30 DIAGNOSIS — F05 Delirium due to known physiological condition: Secondary | ICD-10-CM

## 2022-10-30 DIAGNOSIS — Z515 Encounter for palliative care: Secondary | ICD-10-CM

## 2022-10-30 MED ORDER — HALOPERIDOL 1 MG PO TABS: ORAL_TABLET | ORAL | 1 refills | Status: DC

## 2022-10-30 NOTE — Telephone Encounter (Signed)
I have set up the refill for you to approve. Thanks!

## 2022-11-02 ENCOUNTER — Telehealth: Payer: Self-pay

## 2022-11-02 ENCOUNTER — Other Ambulatory Visit: Payer: Self-pay | Admitting: Hematology and Oncology

## 2022-11-02 MED ORDER — MORPHINE SULFATE (CONCENTRATE) 20 MG/ML PO SOLN: 5.0000 mg | ORAL | 0 refills | Status: DC | PRN

## 2022-11-02 NOTE — Telephone Encounter (Signed)
-----   Message from Adah Perl, PA-C sent at 11/02/2022 10:59 AM EST ----- Regarding: RE: Morphine Rx sent. Thanks ----- Message ----- From: Dyane Dustman, RN Sent: 11/02/2022  10:50 AM EST To: Adah Perl, PA-C Subject: Morphine                                       Maria Holloway with Hospice @ [phone] is requesting Morphine 20mg /ml concentrate.  Dose 0.62ml/5mg  q4hrs prn SHOB or pain.  Spouse has reported sleeping more, eating less, talking to deceased family members and difficult breathing.

## 2022-11-02 NOTE — Telephone Encounter (Signed)
Sonya aware.

## 2022-11-03 ENCOUNTER — Other Ambulatory Visit: Payer: Self-pay | Admitting: Oncology

## 2022-11-06 ENCOUNTER — Telehealth: Payer: Self-pay

## 2022-11-06 NOTE — Telephone Encounter (Signed)
[name],RN, requesting verbal order for Seroquel 25mg  po q HS. She states the Haldol alone isn't working per pt's spouse. They have Seroquel in the home, do not need refill.

## 2022-11-07 ENCOUNTER — Telehealth: Payer: Self-pay

## 2022-11-07 NOTE — Telephone Encounter (Signed)
[name],Rn, reports pt fell again in the home. Her spouse found her on the ground. No injury. He also didn't start the Seroquel last night. He plans to start it tonight.

## 2022-11-17 ENCOUNTER — Other Ambulatory Visit: Payer: Self-pay | Admitting: Oncology

## 2022-11-17 ENCOUNTER — Telehealth: Payer: Self-pay

## 2022-11-17 DIAGNOSIS — Z515 Encounter for palliative care: Secondary | ICD-10-CM

## 2022-11-17 DIAGNOSIS — F05 Delirium due to known physiological condition: Secondary | ICD-10-CM

## 2022-11-17 DIAGNOSIS — F419 Anxiety disorder, unspecified: Secondary | ICD-10-CM

## 2022-11-17 MED ORDER — QUETIAPINE FUMARATE 50 MG PO TABS: 50.0000 mg | ORAL_TABLET | Freq: Every day | ORAL | Status: DC

## 2022-11-17 MED ORDER — HALOPERIDOL 1 MG PO TABS: ORAL_TABLET | ORAL | 1 refills | Status: DC

## 2022-11-17 NOTE — Telephone Encounter (Signed)
Received call from [place], @ [facility]. Pt needs refill of Haldol and increase in Seroquel.

## 2022-11-21 ENCOUNTER — Telehealth: Payer: Self-pay

## 2022-11-21 ENCOUNTER — Other Ambulatory Visit: Payer: Self-pay

## 2022-11-21 MED ORDER — SENNOSIDES-DOCUSATE SODIUM 8.6-50 MG PO TABS: 2.0000 | ORAL_TABLET | Freq: Every evening | ORAL | 1 refills | Status: DC | PRN

## 2022-11-21 NOTE — Telephone Encounter (Signed)
Gina,RN, reports that pt fell this morning in the bathroom, without injury. Her spouse left her unattended per Gina,RN.

## 2022-11-29 ENCOUNTER — Other Ambulatory Visit: Payer: Self-pay

## 2022-11-29 DIAGNOSIS — K2101 Gastro-esophageal reflux disease with esophagitis, with bleeding: Secondary | ICD-10-CM

## 2022-11-29 DIAGNOSIS — F05 Delirium due to known physiological condition: Secondary | ICD-10-CM

## 2022-11-29 MED ORDER — QUETIAPINE FUMARATE 25 MG PO TABS: 25.0000 mg | ORAL_TABLET | Freq: Every day | ORAL | 1 refills | Status: DC

## 2022-11-29 MED ORDER — PANTOPRAZOLE SODIUM 40 MG PO TBEC: 40.0000 mg | DELAYED_RELEASE_TABLET | Freq: Every day | ORAL | 1 refills | Status: DC

## 2022-11-30 ENCOUNTER — Other Ambulatory Visit: Payer: Self-pay | Admitting: Hematology and Oncology

## 2022-11-30 DIAGNOSIS — F05 Delirium due to known physiological condition: Secondary | ICD-10-CM

## 2022-11-30 MED ORDER — QUETIAPINE FUMARATE 25 MG PO TABS: 25.0000 mg | ORAL_TABLET | Freq: Every day | ORAL | 1 refills | Status: DC

## 2022-12-05 ENCOUNTER — Telehealth: Payer: Self-pay

## 2022-12-05 NOTE — Telephone Encounter (Signed)
Sonya from Hospice called and left message patient had a fall no injury noted. Just making [place] aware no call back needed.

## 2022-12-07 ENCOUNTER — Telehealth: Payer: Self-pay

## 2022-12-07 NOTE — Telephone Encounter (Signed)
[name],RN, LVM on nurse line that pt fell again this morning. No injury.

## 2022-12-11 ENCOUNTER — Telehealth: Payer: Self-pay

## 2022-12-11 NOTE — Telephone Encounter (Signed)
-----   Message from Derwood Kaplan, MD sent at 12/11/2022 11:23 AM EST ----- Regarding: RE: B12 tablets yes ----- Message ----- From: Georgette Shell, RN Sent: 12/11/2022  11:10 AM EST To: Derwood Kaplan, MD Subject: B12 tablets                                    Sonya, hospice called.  Spouse wants to stop her B12 tablets. Is that okay with you?

## 2022-12-11 NOTE — Telephone Encounter (Signed)
Left VM with Sonya.

## 2022-12-19 ENCOUNTER — Telehealth: Payer: Self-pay

## 2022-12-19 NOTE — Telephone Encounter (Signed)
Spoke with Shelda Pal at hospice.  Gave her Zachery Dauer. PA recommendations.

## 2022-12-19 NOTE — Telephone Encounter (Signed)
Stephanie,Rn, Hospice nurse, LVM on nurse line reporting pt is having increased agitation & anxiety. Her spouse gives her Seroquel 25mg  @ HS, & haldol 1mg  1-2 tabs po q 4hr PRN (He gave her total of 5 tabs yesterday and didn't phase her per spouse). Colletta [place] asking to possibly increase Seroquel 25mg  to 1 tab in am, and 2 tabs @ HS. Also, possibly adding lorazepam? Please advise.

## 2022-12-21 ENCOUNTER — Other Ambulatory Visit: Payer: Self-pay

## 2022-12-21 MED ORDER — SENNOSIDES-DOCUSATE SODIUM 8.6-50 MG PO TABS: 2.0000 | ORAL_TABLET | Freq: Every evening | ORAL | 1 refills | Status: DC | PRN

## 2022-12-22 ENCOUNTER — Other Ambulatory Visit: Payer: Self-pay | Admitting: Hematology and Oncology

## 2022-12-22 DIAGNOSIS — F05 Delirium due to known physiological condition: Secondary | ICD-10-CM

## 2022-12-25 ENCOUNTER — Telehealth: Payer: Self-pay

## 2022-12-25 NOTE — Telephone Encounter (Signed)
Hospice nurse notifying us that pt stopped taking her Vit D3. Is it ok for her to d/c folic acid? Also would like to wean pt off Aricept.. recommendation is to decrease to Aricept 5mg  qd to Aricept 5mg  Mon, Wed, and Friday, then stop.

## 2023-01-05 ENCOUNTER — Telehealth: Payer: Self-pay

## 2023-01-05 NOTE — Telephone Encounter (Signed)
Aletta Edouard, W/[facility], called to make [place] aware that pt fell @ 140pm. She is unable to bear weight on right leg due to hip pain. They gave her pain medication and have called EMS.

## 2023-01-15 ENCOUNTER — Other Ambulatory Visit: Payer: Self-pay

## 2023-01-15 DIAGNOSIS — K5900 Constipation, unspecified: Secondary | ICD-10-CM

## 2023-01-15 DIAGNOSIS — Z515 Encounter for palliative care: Secondary | ICD-10-CM

## 2023-01-15 MED ORDER — BISACODYL 10 MG RE SUPP: 10.0000 mg | RECTAL | 0 refills | Status: DC | PRN

## 2023-01-19 ENCOUNTER — Other Ambulatory Visit: Payer: Self-pay | Admitting: Hematology and Oncology

## 2023-01-19 ENCOUNTER — Telehealth: Payer: Self-pay

## 2023-01-19 DIAGNOSIS — F411 Generalized anxiety disorder: Secondary | ICD-10-CM

## 2023-01-19 DIAGNOSIS — F05 Delirium due to known physiological condition: Secondary | ICD-10-CM

## 2023-01-19 DIAGNOSIS — R451 Restlessness and agitation: Secondary | ICD-10-CM

## 2023-01-19 NOTE — Telephone Encounter (Signed)
[name],RN, called to report that pt rolled out of bed, no injury. She has hospital bed in home, but her husband prefers to sleep with her.

## 2023-01-23 ENCOUNTER — Telehealth: Payer: Self-pay

## 2023-01-23 NOTE — Telephone Encounter (Signed)
[name],RN, called to report that pt has had another 3 falls recently. She has bruising on top right foot, more bruising @ Right hip, left eye periorbital bruising, and bilateral small skin tears on top of hands. She has had a total of 26 falls since Hospice admission. [name], is on visit today to recertify her for Hospice care. Pt refuses to uses to hospital bed.

## 2023-01-26 ENCOUNTER — Telehealth: Payer: Self-pay

## 2023-01-26 NOTE — Telephone Encounter (Signed)
Sonya at hospice made aware .

## 2023-01-26 NOTE — Telephone Encounter (Signed)
Maria Holloway with [facility] called and voiced there medical director had suggested Seroquel '25mg'$  BID and scheduled tylenol '650mg'$  BID. Patient husband has started this without order being approved they voiced patient is comfortable and BP is 128/78. Is it fine with you to ok this order?

## 2023-02-02 ENCOUNTER — Other Ambulatory Visit: Payer: Self-pay | Admitting: Oncology

## 2023-02-02 ENCOUNTER — Telehealth: Payer: Self-pay

## 2023-02-02 DIAGNOSIS — F03C18 Unspecified dementia, severe, with other behavioral disturbance: Secondary | ICD-10-CM

## 2023-02-02 DIAGNOSIS — M159 Polyosteoarthritis, unspecified: Secondary | ICD-10-CM

## 2023-02-02 MED ORDER — QUETIAPINE FUMARATE 25 MG PO TABS: 75.0000 mg | ORAL_TABLET | Freq: Every day | ORAL | 3 refills | Status: DC

## 2023-02-02 MED ORDER — ACETAMINOPHEN 325 MG PO TABS: 650.0000 mg | ORAL_TABLET | Freq: Four times a day (QID) | ORAL | 3 refills | Status: DC | PRN

## 2023-02-02 NOTE — Telephone Encounter (Signed)
[name],RN, Hospice nurse called to give update about Seroquel and pt's behavior. Her current dosage of Seroquel is '25mg'$  po BID. Her husband states she does pretty good during the day, but she isn't resting @ night. He gives her haldol with the Seroquel @ HS, they sleep maybe 2 hrs. Requesting increase Seroquel to '75mg'$  Q HS (will need this script sent). No recent falls. Pt also needs a refill of her acetaminophen '350mg'$  2 tabs po q 6hr PRN pain/temp.

## 2023-02-05 ENCOUNTER — Telehealth: Payer: Self-pay

## 2023-02-05 NOTE — Telephone Encounter (Signed)
[name],RN, Hospice nurse, LVM on nurse line reporting pt had fallen again without injury.

## 2023-02-07 ENCOUNTER — Telehealth: Payer: Self-pay

## 2023-02-07 NOTE — Telephone Encounter (Signed)
I spoke with [name],RN w/[facility]. She reports that pt's spouse states she is still restless/anxious. She is requesting to increase the Sertraline from '50mg'$  po qd to Sertraline '100mg'$  po qd? Pt's spouse also called on call nurse last evening. He reported that, "she wasn't acting right. She couldn't get her words out. It was just like sounds". He didn't want the Hospice nurse to go out this morning, because he said she is back to normal. The Hospice nurses were just thinking she may have had a TIA or something.

## 2023-02-08 ENCOUNTER — Other Ambulatory Visit: Payer: Self-pay | Admitting: Oncology

## 2023-02-08 ENCOUNTER — Other Ambulatory Visit: Payer: Self-pay

## 2023-02-08 DIAGNOSIS — F03C18 Unspecified dementia, severe, with other behavioral disturbance: Secondary | ICD-10-CM

## 2023-02-08 DIAGNOSIS — F419 Anxiety disorder, unspecified: Secondary | ICD-10-CM

## 2023-02-08 DIAGNOSIS — F05 Delirium due to known physiological condition: Secondary | ICD-10-CM

## 2023-02-08 MED ORDER — SERTRALINE HCL 100 MG PO TABS: 100.0000 mg | ORAL_TABLET | Freq: Every day | ORAL | 5 refills | Status: DC

## 2023-02-08 MED ORDER — QUETIAPINE FUMARATE 100 MG PO TABS: 100.0000 mg | ORAL_TABLET | Freq: Every day | ORAL | 5 refills | Status: DC

## 2023-02-08 MED ORDER — QUETIAPINE FUMARATE 25 MG PO TABS: ORAL_TABLET | ORAL | 0 refills | Status: DC

## 2023-02-13 ENCOUNTER — Telehealth: Payer: Self-pay

## 2023-02-13 NOTE — Telephone Encounter (Signed)
Pt currently taking Seroquel '25mg'$  tabs 2 tabs in am, 3 tabs @ HS.  She is requesting to increase am and pm doses. Increase night dose to Seroquel '100mg'$ , and increase morning dose to '75mg'$  or '100mg'$  - whatever you think... Please advise.

## 2023-02-15 ENCOUNTER — Other Ambulatory Visit: Payer: Self-pay

## 2023-02-15 DIAGNOSIS — F419 Anxiety disorder, unspecified: Secondary | ICD-10-CM

## 2023-02-15 DIAGNOSIS — Z515 Encounter for palliative care: Secondary | ICD-10-CM

## 2023-02-15 DIAGNOSIS — F05 Delirium due to known physiological condition: Secondary | ICD-10-CM

## 2023-02-15 MED ORDER — HALOPERIDOL 1 MG PO TABS: ORAL_TABLET | ORAL | 1 refills | Status: DC

## 2023-02-15 NOTE — Telephone Encounter (Signed)
Hospice pt

## 2023-03-02 ENCOUNTER — Other Ambulatory Visit: Payer: Self-pay | Admitting: Oncology

## 2023-03-02 DIAGNOSIS — F03C18 Unspecified dementia, severe, with other behavioral disturbance: Secondary | ICD-10-CM

## 2023-03-04 ENCOUNTER — Other Ambulatory Visit: Payer: Self-pay | Admitting: Oncology

## 2023-03-04 DIAGNOSIS — F419 Anxiety disorder, unspecified: Secondary | ICD-10-CM

## 2023-03-04 DIAGNOSIS — F05 Delirium due to known physiological condition: Secondary | ICD-10-CM

## 2023-03-07 ENCOUNTER — Telehealth: Payer: Self-pay

## 2023-03-07 DIAGNOSIS — F419 Anxiety disorder, unspecified: Secondary | ICD-10-CM

## 2023-03-07 DIAGNOSIS — F05 Delirium due to known physiological condition: Secondary | ICD-10-CM

## 2023-03-07 NOTE — Telephone Encounter (Signed)
Received call from [place] @ [place]. Pt is beginning to have increased agitation in the mornings. She is presently taking Seroquel 75mg  in am, 100mg  @ HS. Also on acetaminophen 650mg  BID per Dr Otelia Santee. Dr Otelia Santee recommends increasing acetaminophen to 975mg  TID if ok with you. Also either increase am Seroquel to Seroquel 100mg  OR add Seroquel 25mg  @ 12n (leave Seroquel 100mg  q HS). Please advise.

## 2023-03-13 ENCOUNTER — Other Ambulatory Visit: Payer: Self-pay | Admitting: Hematology and Oncology

## 2023-03-19 ENCOUNTER — Other Ambulatory Visit: Payer: Self-pay | Admitting: Hematology and Oncology

## 2023-03-19 ENCOUNTER — Telehealth: Payer: Self-pay

## 2023-03-19 MED ORDER — QUETIAPINE FUMARATE 100 MG PO TABS: ORAL_TABLET | ORAL | 2 refills | Status: DC

## 2023-03-19 NOTE — Telephone Encounter (Signed)
Pt currently takes Seroquel 100mg  po BID. She didn't know if we would like to add midday dose or increase HS dose....  She has haldol, but spouse only gives that to her once during the night.

## 2023-03-21 ENCOUNTER — Telehealth: Payer: Self-pay

## 2023-03-21 NOTE — Telephone Encounter (Signed)
[name],Rn, Hospice nurse reports that pt is combative with personal care. They would like to know if increasing am Seroquel dose is better option, than Haldol? Please advise.

## 2023-03-27 ENCOUNTER — Telehealth: Payer: Self-pay

## 2023-03-27 NOTE — Telephone Encounter (Signed)
Lisa,Hospice nurse wants to make sure you are in agreement for mobile xray to go to pt's home for xray? Until then, pt right wrist wrapped per nurse in pt's home, ice pack applied, and elevated.

## 2023-03-29 ENCOUNTER — Other Ambulatory Visit: Payer: Self-pay

## 2023-03-29 ENCOUNTER — Other Ambulatory Visit: Payer: Self-pay | Admitting: Hematology and Oncology

## 2023-03-29 DIAGNOSIS — K2101 Gastro-esophageal reflux disease with esophagitis, with bleeding: Secondary | ICD-10-CM

## 2023-03-29 DIAGNOSIS — K5900 Constipation, unspecified: Secondary | ICD-10-CM

## 2023-03-29 MED ORDER — SENNOSIDES-DOCUSATE SODIUM 8.6-50 MG PO TABS
2.0000 | ORAL_TABLET | Freq: Two times a day (BID) | ORAL | 1 refills | Status: DC
Start: 2023-03-29 — End: 2023-04-24

## 2023-03-29 MED ORDER — PANTOPRAZOLE SODIUM 40 MG PO TBEC
40.0000 mg | DELAYED_RELEASE_TABLET | Freq: Every day | ORAL | 1 refills | Status: DC
Start: 2023-03-29 — End: 2023-04-24

## 2023-04-03 ENCOUNTER — Telehealth: Payer: Self-pay

## 2023-04-03 NOTE — Telephone Encounter (Signed)
-----   Message from Adah Perl, PA-C sent at 04/03/2023  9:59 AM EDT ----- Regarding: RE: medications Perfect, thanks ----- Message ----- From: Dyane Dustman, RN Sent: 04/03/2023   9:32 AM EDT To: Adah Perl, PA-C Subject: FW: medications                                Spoke with Efraim Kaufmann, Triage RN at [facility],  Patient has Ativan 1 mg q4hrs prn for anxiety.  In order to give for the Jerking they just needed the directions to read.  Ativan 1mg  q4hrs prn for anxiety and Jerking. I told them that was okay.     ----- Message ----- From: Adah Perl, PA-C Sent: 04/02/2023   4:45 PM EDT To: Dyane Dustman, RN Subject: RE: medications                                No dosing recommendations on the Ativan? OK for the Haldol dose as below.  ----- Message ----- From: Dyane Dustman, RN Sent: 04/02/2023   4:42 PM EDT To: Dellia Beckwith, MD; Adah Perl, PA-C Subject: medications                                    Sonya called from Hospice.  Patient is experiencing some left and right arm jerking d/t morphine.  Patient had vomiting x3 yesterday with the last one being 3am this morning.  She has been out of senna for the past week and has had no BM for a week.  They went out last night and had to use enema and disimpact her.  Patient spouse did pick up senna today.     Sonya, Hospice is asking for Ativan for the morphine jerks and Haldol to read 1-2mg  q2hrs prn nausea or agitation - which they have the Haldol in the house.      Your recommendation:

## 2023-04-21 ENCOUNTER — Other Ambulatory Visit: Payer: Self-pay | Admitting: Hematology and Oncology

## 2023-04-22 ENCOUNTER — Other Ambulatory Visit: Payer: Self-pay | Admitting: Hematology and Oncology

## 2023-04-22 DIAGNOSIS — K2101 Gastro-esophageal reflux disease with esophagitis, with bleeding: Secondary | ICD-10-CM

## 2023-04-24 ENCOUNTER — Other Ambulatory Visit: Payer: Self-pay

## 2023-04-24 MED ORDER — SENNOSIDES-DOCUSATE SODIUM 8.6-50 MG PO TABS: 3.0000 | ORAL_TABLET | Freq: Two times a day (BID) | ORAL | 2 refills | Status: DC

## 2023-04-24 NOTE — Telephone Encounter (Signed)
GERD

## 2023-04-24 NOTE — Telephone Encounter (Signed)
Hospice pt

## 2023-04-26 ENCOUNTER — Other Ambulatory Visit: Payer: Self-pay

## 2023-04-26 DIAGNOSIS — F411 Generalized anxiety disorder: Secondary | ICD-10-CM

## 2023-04-26 DIAGNOSIS — Z515 Encounter for palliative care: Secondary | ICD-10-CM

## 2023-04-26 MED ORDER — LORAZEPAM 1 MG PO TABS: 1.0000 mg | ORAL_TABLET | ORAL | 0 refills | Status: DC | PRN

## 2023-04-27 ENCOUNTER — Other Ambulatory Visit: Payer: Self-pay | Admitting: Oncology

## 2023-04-27 ENCOUNTER — Telehealth: Payer: Self-pay

## 2023-04-27 DIAGNOSIS — F02C18 Dementia in other diseases classified elsewhere, severe, with other behavioral disturbance: Secondary | ICD-10-CM

## 2023-04-27 DIAGNOSIS — F02C11 Dementia in other diseases classified elsewhere, severe, with agitation: Secondary | ICD-10-CM

## 2023-04-27 MED ORDER — SENNOSIDES-DOCUSATE SODIUM 8.6-50 MG PO TABS
3.0000 | ORAL_TABLET | Freq: Two times a day (BID) | ORAL | 2 refills | Status: DC
Start: 2023-04-27 — End: 2023-05-22

## 2023-04-27 MED ORDER — ACETAMINOPHEN 325 MG PO TABS
975.0000 mg | ORAL_TABLET | Freq: Two times a day (BID) | ORAL | 5 refills | Status: DC
Start: 2023-04-27 — End: 2023-05-22

## 2023-04-27 MED ORDER — DIVALPROEX SODIUM 125 MG PO CSDR
125.0000 mg | DELAYED_RELEASE_CAPSULE | Freq: Every day | ORAL | 5 refills | Status: DC
Start: 2023-04-27 — End: 2023-05-11

## 2023-04-27 NOTE — Telephone Encounter (Signed)
[name],Rn, of Hospice requests the following med changes: Tylenol 975mg  TID to BID Senna-S 2 tabs to 3 tabs po BID ? Addition to current agitation management   - Pt takes Seroquel 200mg  po BID. Spouse is having to give her Ativan in between. [name],RN, asked Dr Excell Seltzer for possible options. He told her that Abilify could be added (but Seroquel would need to be decreased) or could add Depakote sprinkles. Pt's agitation is worse in the mornings.  Please advise.

## 2023-05-07 ENCOUNTER — Other Ambulatory Visit: Payer: Self-pay | Admitting: Oncology

## 2023-05-11 ENCOUNTER — Telehealth: Payer: Self-pay

## 2023-05-11 NOTE — Telephone Encounter (Signed)
Depakote sprinkles are working well. Hospice nurse req increase the Depakote sprinkles to BID? They do not need new prescription sent as of yet.

## 2023-05-15 ENCOUNTER — Other Ambulatory Visit: Payer: Self-pay | Admitting: Hematology and Oncology

## 2023-05-17 ENCOUNTER — Telehealth: Payer: Self-pay

## 2023-05-17 NOTE — Telephone Encounter (Signed)
Pt is constantly moving her legs around. I gave her dose of Morphine and it stopped, but her husband wont give that without a nurse there. [name],RN, was asking about Requip or Mirapex. However, Dr Nancee Liter suggested increasing the Depakote sprinkles to 250mg  @ HS first. Please advise.

## 2023-05-18 ENCOUNTER — Other Ambulatory Visit: Payer: Self-pay

## 2023-05-18 DIAGNOSIS — Z515 Encounter for palliative care: Secondary | ICD-10-CM

## 2023-05-18 DIAGNOSIS — R451 Restlessness and agitation: Secondary | ICD-10-CM

## 2023-05-18 MED ORDER — DIVALPROEX SODIUM 125 MG PO CSDR
DELAYED_RELEASE_CAPSULE | ORAL | 0 refills | Status: DC
Start: 2023-05-18 — End: 2023-05-22

## 2023-06-04 DEATH — deceased

## 2023-07-30 IMAGING — DX DG WRIST 2V*L*
1 series · 2 of 2 positions shown · non-contrast
Comparison: None.

CLINICAL DATA: Bony protrusion along left wrist, recent fall

EXAM:
LEFT WRIST - 2 VIEW

[Series 1: wrist · 0.14mm/px · 2 of 2 slices shown]
[im 1/2]
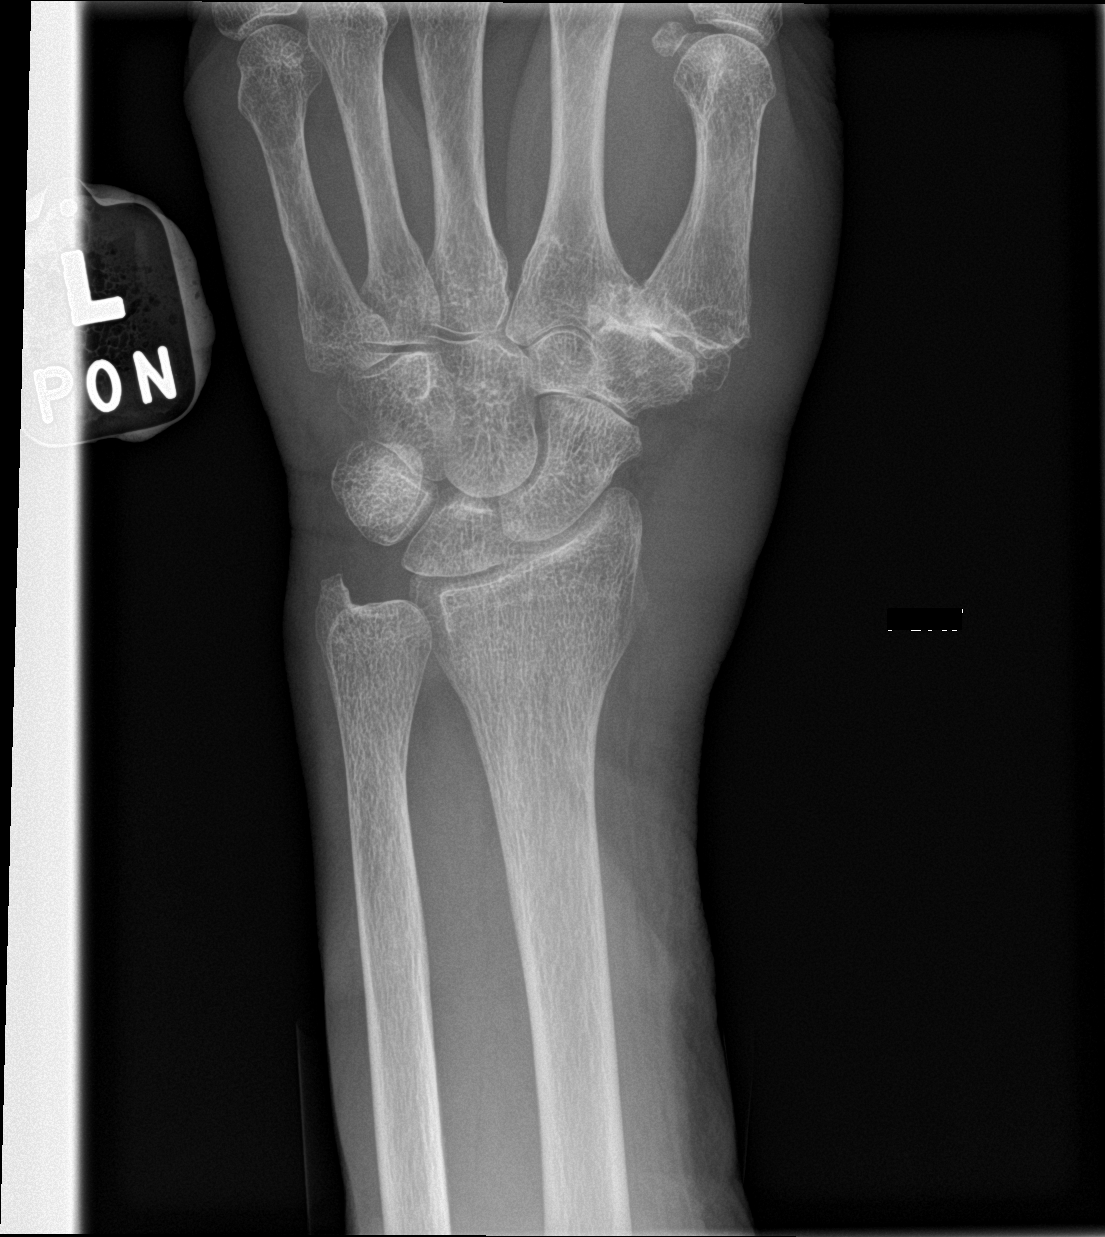
[im 2/2]
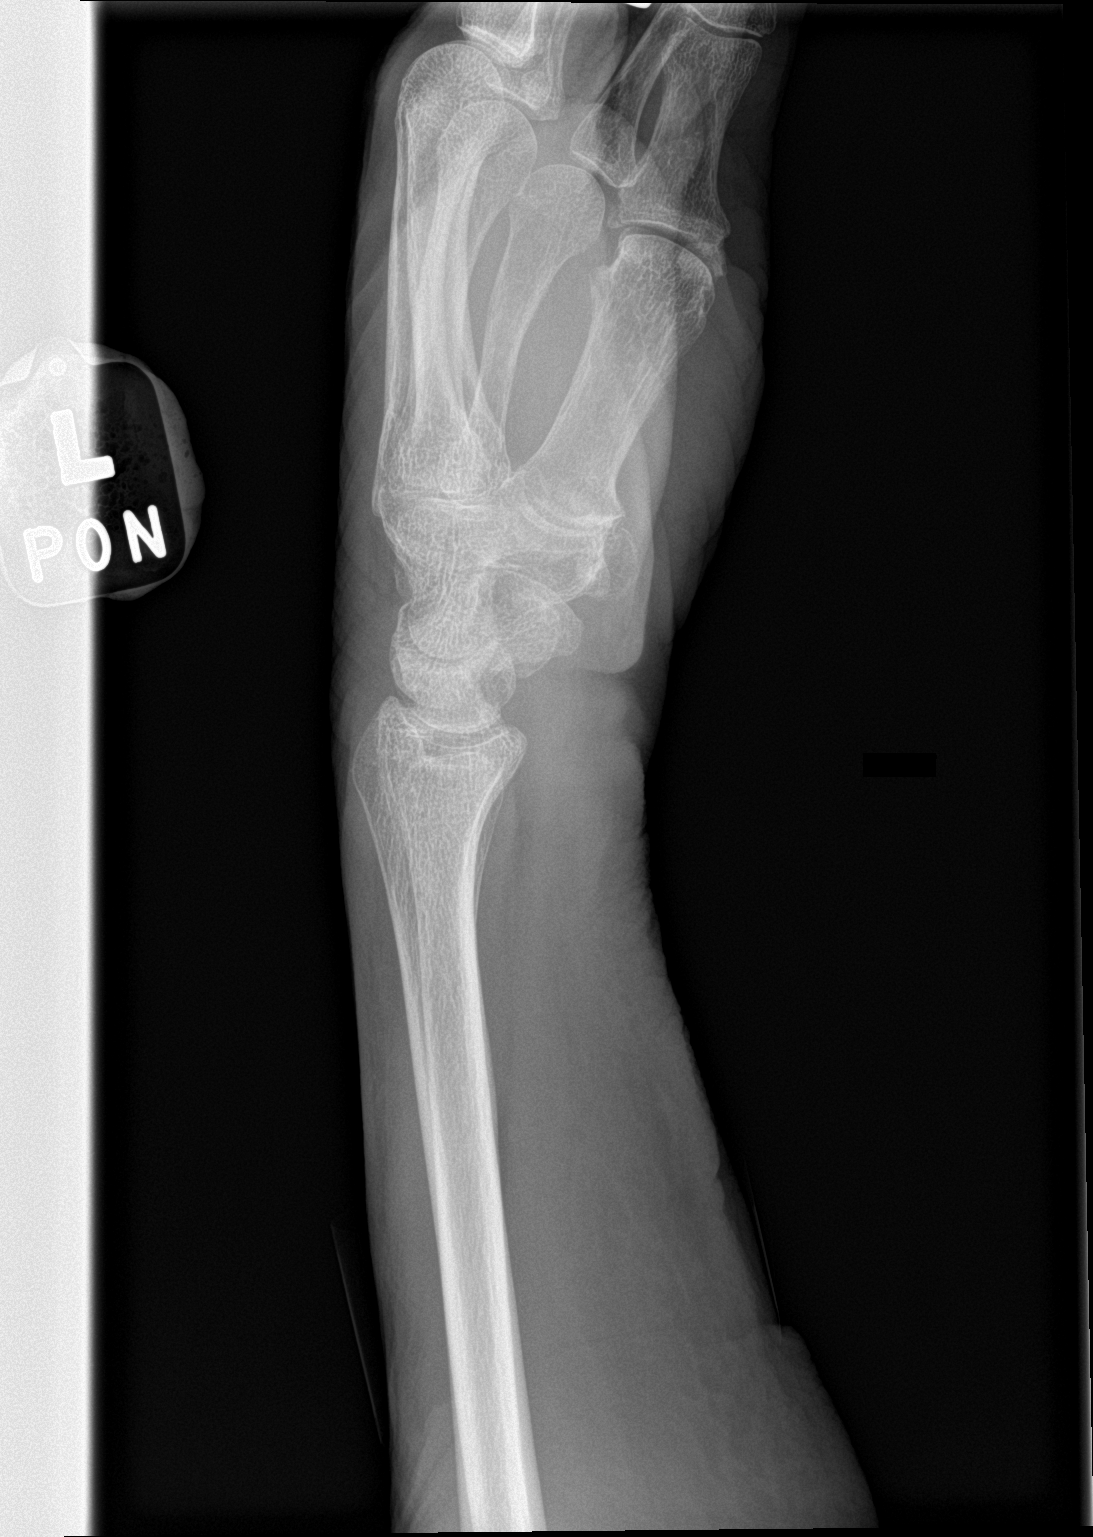

[2 of 2 positions shown; findings below may reference images not displayed]

FINDINGS: There is no evidence of fracture or dislocation. Focal, severe
arthrosis of the thumb carpometacarpal joint. Joint spaces are
otherwise well preserved. Soft tissues are unremarkable.
IMPRESSION: 1. No fracture or dislocation of the left wrist. The carpus is
normally aligned.

2. Focal, severe arthrosis of the thumb carpometacarpal joint. Joint
spaces are otherwise well preserved.
